# Patient Record
Sex: Female | Born: 1985 | Race: White | Hispanic: No | Marital: Married | Smoking: Never smoker
Health system: Southern US, Community
[De-identification: ages and names within clinical notes are randomized; demographics above are authoritative.]

## PROBLEM LIST (undated history)

## (undated) ENCOUNTER — Inpatient Hospital Stay (HOSPITAL_COMMUNITY): Payer: Self-pay

## (undated) DIAGNOSIS — F419 Anxiety disorder, unspecified: Secondary | ICD-10-CM

## (undated) HISTORY — PX: WISDOM TOOTH EXTRACTION: SHX21

---
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
## 2008-02-24 ENCOUNTER — Encounter: Payer: Self-pay | Admitting: Internal Medicine

## 2009-05-16 ENCOUNTER — Encounter: Payer: Self-pay | Admitting: Internal Medicine

## 2009-05-17 ENCOUNTER — Encounter: Payer: Self-pay | Admitting: Internal Medicine

## 2009-05-29 ENCOUNTER — Encounter: Payer: Self-pay | Admitting: Internal Medicine

## 2009-07-03 ENCOUNTER — Encounter: Payer: Self-pay | Admitting: Internal Medicine

## 2009-07-07 ENCOUNTER — Ambulatory Visit: Payer: Self-pay | Admitting: Cardiology

## 2009-07-07 ENCOUNTER — Encounter: Payer: Self-pay | Admitting: Internal Medicine

## 2009-07-24 ENCOUNTER — Ambulatory Visit: Payer: Self-pay | Admitting: Internal Medicine

## 2009-07-24 ENCOUNTER — Encounter (INDEPENDENT_AMBULATORY_CARE_PROVIDER_SITE_OTHER): Payer: Self-pay | Admitting: *Deleted

## 2009-07-24 DIAGNOSIS — R9389 Abnormal findings on diagnostic imaging of other specified body structures: Secondary | ICD-10-CM | POA: Insufficient documentation

## 2009-07-24 DIAGNOSIS — R072 Precordial pain: Secondary | ICD-10-CM | POA: Insufficient documentation

## 2009-07-24 HISTORY — DX: Abnormal findings on diagnostic imaging of other specified body structures: R93.89

## 2009-07-24 HISTORY — DX: Precordial pain: R07.2

## 2009-07-26 ENCOUNTER — Telehealth: Payer: Self-pay | Admitting: Internal Medicine

## 2009-07-31 ENCOUNTER — Telehealth: Payer: Self-pay | Admitting: Internal Medicine

## 2009-08-02 ENCOUNTER — Ambulatory Visit: Payer: Self-pay | Admitting: Internal Medicine

## 2009-08-02 ENCOUNTER — Encounter (INDEPENDENT_AMBULATORY_CARE_PROVIDER_SITE_OTHER): Payer: Self-pay | Admitting: *Deleted

## 2009-08-02 DIAGNOSIS — R071 Chest pain on breathing: Secondary | ICD-10-CM

## 2009-08-02 DIAGNOSIS — R1013 Epigastric pain: Secondary | ICD-10-CM | POA: Insufficient documentation

## 2009-08-02 HISTORY — DX: Chest pain on breathing: R07.1

## 2009-08-02 HISTORY — DX: Epigastric pain: R10.13

## 2009-08-03 ENCOUNTER — Ambulatory Visit: Payer: Self-pay | Admitting: Internal Medicine

## 2009-08-10 ENCOUNTER — Telehealth: Payer: Self-pay | Admitting: Internal Medicine

## 2009-08-31 ENCOUNTER — Ambulatory Visit: Payer: Self-pay | Admitting: Psychology

## 2009-09-14 ENCOUNTER — Telehealth: Payer: Self-pay | Admitting: Internal Medicine

## 2009-11-09 ENCOUNTER — Encounter: Payer: Self-pay | Admitting: Internal Medicine
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
## 2010-05-01 NOTE — Letter (Signed)
Summary: EGD Instructions  [facility]  [address] [place], [place] [address]   Phone: [phone]  Fax: [phone]       Sheila Little    1986/03/31    MRN: [id number]       Procedure Day Dorna Bloom: Lenor Coffin, 08/03/09     Arrival Time: 1:30 PM      Procedure Time: 2:30 PM     Location of Procedure:                    _X_ [facility] (4th Floor)   PREPARATION FOR ENDOSCOPY   On THURSDAY, 08/03/09, THE DAY OF THE PROCEDURE:  1.   No solid foods, milk or milk products are allowed after midnight the night before your procedure.  2.   Do not drink anything colored red or purple.  Avoid juices with pulp.  No orange juice.  3.  You may drink clear liquids until 12:30 PM, which is 2 hours before your procedure.                                                                                                CLEAR LIQUIDS INCLUDE: Water Jello Ice Popsicles Tea (sugar ok, no milk/cream) Powdered fruit flavored drinks Coffee (sugar ok, no milk/cream) Gatorade Juice: apple, white grape, white cranberry  Lemonade Clear bullion, consomm, broth Carbonated beverages (any kind) Strained chicken noodle soup Hard Candy   MEDICATION INSTRUCTIONS  Unless otherwise instructed, you should take regular prescription medications with a small sip of water as early as possible the morning of your procedure.                 OTHER INSTRUCTIONS  You will need a responsible adult at least 23 years of age to accompany you and drive you home.   This person must remain in the waiting room during your procedure.  Wear loose fitting clothing that is easily removed.  Leave jewelry and other valuables at home.  However, you may wish to bring a book to read or an iPod/MP3 player to listen to music as you wait for your procedure to start.  Remove all body piercing jewelry and leave at home.  Total time from sign-in until discharge is approximately 2-3 hours.  You  should go home directly after your procedure and rest.  You can resume normal activities the day after your procedure.  The day of your procedure you should not:   Drive   Make legal decisions   Operate machinery   Drink alcohol   Return to work  You will receive specific instructions about eating, activities and medications before you leave.    The above instructions have been reviewed and explained to me by   _______________________    I fully understand and can verbalize these instructions _____________________________ Date _________

## 2010-05-01 NOTE — Letter (Signed)
Summary: Work Writer, Main Office  [address]   [place], [place] [address]   Phone: [phone]  Fax: [phone]     July 24, 2009    [facility]   The above named patient had a medical visit today at:  12:00 pm.  Please take this into consideration when reviewing the time away from school.      Sincerely yours,   Dr. Arvilla Meres [facility]

## 2010-05-01 NOTE — Progress Notes (Signed)
Summary: needs GI appt next week (with Dr. Leone Payor)  Phone Note From Other Clinic   Caller: Dr. Gala Romney Summary of Call: request for appt with me next week re: abdominal pain call received Apr 25 will arrange for appt next week and contact patient with info Initial call taken by: Iva Boop MD, Clementeen Graham,  July 26, 2009 7:25 AM  Follow-up for Phone Call        Patient  aware of referral and she is scheduled to see Dr Leone Payor 08/02/09 3:15 Follow-up by: Darcey Nora RN, CGRN,  July 26, 2009 8:40 AM

## 2010-05-01 NOTE — Progress Notes (Signed)
Summary: cx'd appts.  Phone Note Outgoing Call Call back at [facility] Phone [phone]   Call placed by: Dr. Gala Romney Call placed to: Patient Details of Request: Dolores Patty, MD, [facility]  September 14, 2009 5:13 PM  Summary of Call: Patient was scheduled for f/u visit and repeat echo to day to reasses RV and pulmonary pressures but she did not show for appt. I called patient and she said she still isn't feeling better but has been working with Dr. Dellia Cloud. I suggested that she should at least have a f/u echo +/- a f/u visit in our clinic. She agreed and I told her we would reschedule. Dolores Patty, MD, [facility]  September 14, 2009 5:13 PM   Follow-up for Phone Call        order placed Meredith Staggers, RN  September 15, 2009 8:10 AM

## 2010-05-01 NOTE — Consult Note (Signed)
Summary: [facility]  [facility]   Imported By: Marylou Mccoy 08/08/2009 09:16:52  _____________________________________________________________________  External Attachment:    Type:   Image     Comment:   External Document

## 2010-05-01 NOTE — Progress Notes (Signed)
Summary: waiting on appt from dr. Orland Mustard  Phone Note Call from Patient Call back at Home Phone [phone]   Caller: Patient Reason for Call: Talk to Nurse, Referral Details for Reason: per pt calling waiting on appt to see dr. Orland Mustard. waiting on response from office  Initial call taken by: Lorne Skeens,  Aug 10, 2009 11:40 AM  Follow-up for Phone Call        spooke w/Jennifer w/Dr Orland Mustard she will call and sch appt and let me know when done Meredith Staggers, RN  Aug 10, 2009 11:53 AM   pt is sch to see Dr Orland Mustard on June 2nd at [facility], RN  Aug 10, 2009 12:10 PM
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
## 2010-05-01 NOTE — Miscellaneous (Signed)
Summary: Nurse, mental health Party Release Form   Imported By: Roderic Ovens 07/28/2009 13:40:13  _____________________________________________________________________  External Attachment:    Type:   Image     Comment:   External Document

## 2010-05-01 NOTE — Procedures (Signed)
Summary: Upper Endoscopy  Patient: Sheila Little Note: All result statuses are Final unless otherwise noted.  Tests: (1) Upper Endoscopy (EGD)   EGD Upper Endoscopy       DONE     [facility]     [address]     [place], [place]  [address]           ENDOSCOPY PROCEDURE REPORT           PATIENT:  [name], [name]  MR#:  [id number]     BIRTHDATE:  01/26/1986, 23 yrs. old  GENDER:  female           ENDOSCOPIST:  Iva Boop, MD, [facility]     Referred by:  Bevelyn Buckles. Bensimhon, M.D.           PROCEDURE DATE:  08/03/2009     PROCEDURE:  EGD with biopsy     ASA CLASS:  Class I     INDICATIONS:  epigastric pain           MEDICATIONS:   Fentanyl 75 mcg IV, Versed 8 mg IV, Benadryl 25 mg     IV     TOPICAL ANESTHETIC:  Exactacain Spray           DESCRIPTION OF PROCEDURE:   After the risks benefits and     alternatives of the procedure were thoroughly explained, informed     consent was obtained.  The LB GIF-H180 [id number] endoscope was     introduced through the mouth and advanced to the second portion of     the duodenum, without limitations.  The instrument was slowly     withdrawn as the mucosa was fully examined.     <<PROCEDUREIMAGES>>           Mild gastritis was found in the antrum. Mottled and erythematous     mucosa raising ? of gastritis. Multiple biopsies were obtained and     sent to pathology.  Otherwise the examination was normal.     Retroflexed views revealed no abnormalities.    The scope was then     withdrawn from the patient and the procedure completed.           COMPLICATIONS:  None           ENDOSCOPIC IMPRESSION:     1) ?Mild gastritis in the antrum - biospied     2) Otherwise normal examination     RECOMMENDATIONS:     1) Await biopsy results - will contact her about these     2) Stop ibuprofen and start Voltaren gel 4 times a day (2 grams)     to lwer chest and upper abdomen.     3) She has already had an ultrasound of abdomen and I am  awiting     review (reported normal).           REPEAT EXAM:  In for as needed.           Iva Boop, MD, Clementeen Graham           CC:  Dolores Patty, MD     Donzetta Sprung, MD     The Patient           n.     Rosalie Doctor:   Iva Boop at 08/03/2009 02:52 PM           Santiago Bumpers, [id number]  Note: An exclamation mark (!) indicates a result that was not  dispersed into the flowsheet. Document Creation Date: 08/03/2009 2:53 PM _______________________________________________________________________  (1) Order result status: Final Collection or observation date-time: 08/03/2009 14:39 Requested date-time:  Receipt date-time:  Reported date-time:  Referring Physician:   Ordering Physician: Stan Head [id number]) Specimen Source:  Source: Launa Grill Order Number: [id number] Lab site:
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
## 2010-05-01 NOTE — Miscellaneous (Signed)
Summary: echo order    Clinical Lists Changes  Orders: Added new Referral order of Echocardiogram (Echo) - Signed

## 2010-05-01 NOTE — Miscellaneous (Signed)
Summary: voltaren gel rx, d/c ibuprofen  Clinical Lists Changes  Medications: Added new medication of VOLTAREN 1 % GEL (DICLOFENAC SODIUM) apply 2 grams to tender lower chest and upper abdominal area 4 times a day - replaces ibuprofen - Signed Removed medication of IBUPROFEN 800 MG TABS (IBUPROFEN) 1 by mouth three times a day Rx of VOLTAREN 1 % GEL (DICLOFENAC SODIUM) apply 2 grams to tender lower chest and upper abdominal area 4 times a day - replaces ibuprofen;  #100 grams x 0;  Signed;  Entered by: Iva Boop MD, Clementeen Graham;  Authorized by: Iva Boop MD, [facility];  Method used: Electronically to [facility] [address] [place], [place], [place]  [address], Ph: [phone], Fax: [phone]    Prescriptions: VOLTAREN 1 % GEL (DICLOFENAC SODIUM) apply 2 grams to tender lower chest and upper abdominal area 4 times a day - replaces ibuprofen  #100 grams x 0   Entered and Authorized by:   Iva Boop MD, [facility]   Signed by:   Iva Boop MD, [facility] on 08/03/2009   Method used:   Electronically to        [facility]* (retail)       [address]       [place], [place]  [address]       Ph: [phone]       Fax: [phone]   RxID:   [id number]

## 2014-01-11 ENCOUNTER — Other Ambulatory Visit: Payer: Self-pay | Admitting: Obstetrics and Gynecology

## 2014-01-12 LAB — CYTOLOGY - PAP

## 2015-08-10 DIAGNOSIS — G43519 Persistent migraine aura without cerebral infarction, intractable, without status migrainosus: Secondary | ICD-10-CM | POA: Diagnosis not present

## 2015-08-10 DIAGNOSIS — Z681 Body mass index (BMI) 19 or less, adult: Secondary | ICD-10-CM | POA: Diagnosis not present

## 2015-08-21 DIAGNOSIS — Z681 Body mass index (BMI) 19 or less, adult: Secondary | ICD-10-CM | POA: Diagnosis not present

## 2015-08-21 DIAGNOSIS — G43911 Migraine, unspecified, intractable, with status migrainosus: Secondary | ICD-10-CM | POA: Diagnosis not present

## 2015-08-21 DIAGNOSIS — R110 Nausea: Secondary | ICD-10-CM | POA: Diagnosis not present

## 2015-08-22 DIAGNOSIS — Z79899 Other long term (current) drug therapy: Secondary | ICD-10-CM | POA: Diagnosis not present

## 2015-08-22 DIAGNOSIS — G43719 Chronic migraine without aura, intractable, without status migrainosus: Secondary | ICD-10-CM | POA: Diagnosis not present

## 2015-08-22 DIAGNOSIS — R51 Headache: Secondary | ICD-10-CM | POA: Diagnosis not present

## 2015-08-22 DIAGNOSIS — Z049 Encounter for examination and observation for unspecified reason: Secondary | ICD-10-CM | POA: Diagnosis not present

## 2015-09-05 DIAGNOSIS — F410 Panic disorder [episodic paroxysmal anxiety] without agoraphobia: Secondary | ICD-10-CM | POA: Diagnosis not present

## 2015-09-05 DIAGNOSIS — Z681 Body mass index (BMI) 19 or less, adult: Secondary | ICD-10-CM | POA: Diagnosis not present

## 2015-09-12 DIAGNOSIS — H5213 Myopia, bilateral: Secondary | ICD-10-CM | POA: Diagnosis not present

## 2015-10-26 DIAGNOSIS — J0101 Acute recurrent maxillary sinusitis: Secondary | ICD-10-CM | POA: Diagnosis not present

## 2015-10-26 DIAGNOSIS — Z681 Body mass index (BMI) 19 or less, adult: Secondary | ICD-10-CM | POA: Diagnosis not present

## 2015-11-13 DIAGNOSIS — D1800 Hemangioma unspecified site: Secondary | ICD-10-CM | POA: Diagnosis not present

## 2015-11-13 DIAGNOSIS — D2330 Other benign neoplasm of skin of unspecified part of face: Secondary | ICD-10-CM | POA: Diagnosis not present

## 2015-11-28 DIAGNOSIS — R1032 Left lower quadrant pain: Secondary | ICD-10-CM | POA: Diagnosis not present

## 2015-11-28 DIAGNOSIS — Z3202 Encounter for pregnancy test, result negative: Secondary | ICD-10-CM | POA: Diagnosis not present

## 2015-12-26 DIAGNOSIS — J019 Acute sinusitis, unspecified: Secondary | ICD-10-CM | POA: Diagnosis not present

## 2015-12-28 DIAGNOSIS — Z681 Body mass index (BMI) 19 or less, adult: Secondary | ICD-10-CM | POA: Diagnosis not present

## 2015-12-28 DIAGNOSIS — J039 Acute tonsillitis, unspecified: Secondary | ICD-10-CM | POA: Diagnosis not present

## 2016-01-25 DIAGNOSIS — Z681 Body mass index (BMI) 19 or less, adult: Secondary | ICD-10-CM | POA: Diagnosis not present

## 2016-01-25 DIAGNOSIS — R112 Nausea with vomiting, unspecified: Secondary | ICD-10-CM | POA: Diagnosis not present

## 2016-01-25 DIAGNOSIS — R5383 Other fatigue: Secondary | ICD-10-CM | POA: Diagnosis not present

## 2016-03-01 DIAGNOSIS — J019 Acute sinusitis, unspecified: Secondary | ICD-10-CM | POA: Diagnosis not present

## 2016-04-01 NOTE — L&D Delivery Note (Signed)
Delivery Note At 3:03 AM a viable female was delivered via Vaginal, Spontaneous Delivery OA  APGAR:9 9 weight pending  Placenta status:spontaneously with 3 vessel cord , .  Cord:  with the following complications: none.  Cord pH: not obtained  Anesthesia:  epidural Episiotomy:  none Lacerations:  first Suture Repair: 3.0 chromic Est. Blood Loss (mL):  300  Mom to postpartum.  Baby to Couplet care / Skin to Skin.  Carmisha Larusso L 10/30/2016, 3:20 AM

## 2016-04-03 DIAGNOSIS — N911 Secondary amenorrhea: Secondary | ICD-10-CM | POA: Diagnosis not present

## 2016-04-10 DIAGNOSIS — Z3A09 9 weeks gestation of pregnancy: Secondary | ICD-10-CM | POA: Diagnosis not present

## 2016-04-10 DIAGNOSIS — Z3401 Encounter for supervision of normal first pregnancy, first trimester: Secondary | ICD-10-CM | POA: Diagnosis not present

## 2016-04-10 DIAGNOSIS — Z3480 Encounter for supervision of other normal pregnancy, unspecified trimester: Secondary | ICD-10-CM | POA: Diagnosis not present

## 2016-04-10 LAB — OB RESULTS CONSOLE RUBELLA ANTIBODY, IGM: RUBELLA: IMMUNE

## 2016-04-10 LAB — OB RESULTS CONSOLE RPR: RPR: NONREACTIVE

## 2016-04-10 LAB — OB RESULTS CONSOLE HIV ANTIBODY (ROUTINE TESTING): HIV: NONREACTIVE

## 2016-04-10 LAB — OB RESULTS CONSOLE GC/CHLAMYDIA
CHLAMYDIA, DNA PROBE: NEGATIVE
GC PROBE AMP, GENITAL: NEGATIVE

## 2016-04-10 LAB — OB RESULTS CONSOLE HEPATITIS B SURFACE ANTIGEN: Hepatitis B Surface Ag: NEGATIVE

## 2016-04-24 DIAGNOSIS — Z3A10 10 weeks gestation of pregnancy: Secondary | ICD-10-CM | POA: Diagnosis not present

## 2016-04-24 DIAGNOSIS — Z3480 Encounter for supervision of other normal pregnancy, unspecified trimester: Secondary | ICD-10-CM | POA: Diagnosis not present

## 2016-04-24 DIAGNOSIS — Z3400 Encounter for supervision of normal first pregnancy, unspecified trimester: Secondary | ICD-10-CM | POA: Diagnosis not present

## 2016-04-24 DIAGNOSIS — Z113 Encounter for screening for infections with a predominantly sexual mode of transmission: Secondary | ICD-10-CM | POA: Diagnosis not present

## 2016-04-26 ENCOUNTER — Encounter (HOSPITAL_COMMUNITY): Payer: Self-pay | Admitting: *Deleted

## 2016-04-26 ENCOUNTER — Inpatient Hospital Stay (HOSPITAL_COMMUNITY)
Admission: AD | Admit: 2016-04-26 | Discharge: 2016-04-27 | Disposition: A | Discharge status: Home / Self Care | Payer: BLUE CROSS/BLUE SHIELD | Source: Ambulatory Visit | Attending: Obstetrics and Gynecology | Admitting: Obstetrics and Gynecology

## 2016-04-26 DIAGNOSIS — A084 Viral intestinal infection, unspecified: Secondary | ICD-10-CM | POA: Diagnosis not present

## 2016-04-26 DIAGNOSIS — R110 Nausea: Secondary | ICD-10-CM | POA: Diagnosis not present

## 2016-04-26 DIAGNOSIS — Z3A11 11 weeks gestation of pregnancy: Secondary | ICD-10-CM | POA: Insufficient documentation

## 2016-04-26 DIAGNOSIS — O219 Vomiting of pregnancy, unspecified: Secondary | ICD-10-CM

## 2016-04-26 DIAGNOSIS — O26891 Other specified pregnancy related conditions, first trimester: Secondary | ICD-10-CM | POA: Diagnosis not present

## 2016-04-26 LAB — URINALYSIS, ROUTINE W REFLEX MICROSCOPIC
Bilirubin Urine: NEGATIVE
GLUCOSE, UA: NEGATIVE mg/dL
HGB URINE DIPSTICK: NEGATIVE
Ketones, ur: 80 mg/dL — AB
LEUKOCYTES UA: NEGATIVE
Nitrite: NEGATIVE
PH: 5 (ref 5.0–8.0)
Protein, ur: NEGATIVE mg/dL
Specific Gravity, Urine: 1.027 (ref 1.005–1.030)

## 2016-04-26 LAB — COMPREHENSIVE METABOLIC PANEL
ALK PHOS: 47 U/L (ref 38–126)
ALT: 22 U/L (ref 14–54)
ANION GAP: 11 (ref 5–15)
AST: 38 U/L (ref 15–41)
Albumin: 3.9 g/dL (ref 3.5–5.0)
BILIRUBIN TOTAL: 1.5 mg/dL — AB (ref 0.3–1.2)
BUN: 16 mg/dL (ref 6–20)
CALCIUM: 8.5 mg/dL — AB (ref 8.9–10.3)
CO2: 21 mmol/L — ABNORMAL LOW (ref 22–32)
Chloride: 103 mmol/L (ref 101–111)
Creatinine, Ser: 0.61 mg/dL (ref 0.44–1.00)
GLUCOSE: 114 mg/dL — AB (ref 65–99)
Potassium: 4 mmol/L (ref 3.5–5.1)
Sodium: 135 mmol/L (ref 135–145)
TOTAL PROTEIN: 6.7 g/dL (ref 6.5–8.1)

## 2016-04-26 LAB — CBC
HEMATOCRIT: 35.3 % — AB (ref 36.0–46.0)
HEMOGLOBIN: 12.3 g/dL (ref 12.0–15.0)
MCH: 30.9 pg (ref 26.0–34.0)
MCHC: 34.8 g/dL (ref 30.0–36.0)
MCV: 88.7 fL (ref 78.0–100.0)
Platelets: 192 10*3/uL (ref 150–400)
RBC: 3.98 MIL/uL (ref 3.87–5.11)
RDW: 13.5 % (ref 11.5–15.5)
WBC: 14.1 10*3/uL — ABNORMAL HIGH (ref 4.0–10.5)

## 2016-04-26 MED ORDER — LOPERAMIDE HCL 2 MG PO CAPS
4.0000 mg | ORAL_CAPSULE | ORAL | Status: DC | PRN
  Filled 2016-04-26: qty 2

## 2016-04-26 MED ORDER — LACTATED RINGERS IV BOLUS (SEPSIS): 1000.0000 mL | Freq: Once | INTRAVENOUS | Status: DC

## 2016-04-26 MED ORDER — PROMETHAZINE HCL 25 MG/ML IJ SOLN
25.0000 mg | Freq: Once | INTRAMUSCULAR | Status: AC
  Administered 2016-04-27: 25 mg via INTRAVENOUS
  Filled 2016-04-26: qty 1

## 2016-04-26 NOTE — MAU Note (Signed)
N/V/D since 1630. Feeling weak and faint. Some chest pain.

## 2016-04-26 NOTE — MAU Note (Signed)
PT  SAYS SHE HAS ONLY HAD  NAUSEA   FOR  MORNING  SICKNESS .  TODAY  STARTED  VOMITING  AND  DIARRHEA.    TEMP 100.3

## 2016-04-27 DIAGNOSIS — A084 Viral intestinal infection, unspecified: Secondary | ICD-10-CM

## 2016-04-27 MED ORDER — PROMETHAZINE HCL 25 MG PO TABS: 12.5000 mg | ORAL_TABLET | Freq: Four times a day (QID) | ORAL | 0 refills | Status: DC | PRN

## 2016-04-27 NOTE — MAU Note (Signed)
NO VOMITING  NOR DIARRHEA  SINCE IN MAU
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
## 2016-05-06 DIAGNOSIS — Z3491 Encounter for supervision of normal pregnancy, unspecified, first trimester: Secondary | ICD-10-CM | POA: Diagnosis not present

## 2016-05-06 DIAGNOSIS — Z3683 Encounter for fetal screening for congenital cardiac abnormalities: Secondary | ICD-10-CM | POA: Diagnosis not present

## 2016-05-06 DIAGNOSIS — Z36 Encounter for antenatal screening for chromosomal anomalies: Secondary | ICD-10-CM | POA: Diagnosis not present

## 2016-05-13 DIAGNOSIS — J111 Influenza due to unidentified influenza virus with other respiratory manifestations: Secondary | ICD-10-CM | POA: Diagnosis not present

## 2016-05-13 DIAGNOSIS — Z681 Body mass index (BMI) 19 or less, adult: Secondary | ICD-10-CM | POA: Diagnosis not present

## 2016-05-24 DIAGNOSIS — Z3A15 15 weeks gestation of pregnancy: Secondary | ICD-10-CM | POA: Diagnosis not present

## 2016-05-24 DIAGNOSIS — Z3480 Encounter for supervision of other normal pregnancy, unspecified trimester: Secondary | ICD-10-CM | POA: Diagnosis not present

## 2016-06-25 DIAGNOSIS — Z363 Encounter for antenatal screening for malformations: Secondary | ICD-10-CM | POA: Diagnosis not present

## 2016-07-18 DIAGNOSIS — S338XXA Sprain of other parts of lumbar spine and pelvis, initial encounter: Secondary | ICD-10-CM | POA: Diagnosis not present

## 2016-07-18 DIAGNOSIS — S134XXA Sprain of ligaments of cervical spine, initial encounter: Secondary | ICD-10-CM | POA: Diagnosis not present

## 2016-07-23 DIAGNOSIS — S134XXA Sprain of ligaments of cervical spine, initial encounter: Secondary | ICD-10-CM | POA: Diagnosis not present

## 2016-07-23 DIAGNOSIS — S338XXA Sprain of other parts of lumbar spine and pelvis, initial encounter: Secondary | ICD-10-CM | POA: Diagnosis not present

## 2016-07-25 DIAGNOSIS — J019 Acute sinusitis, unspecified: Secondary | ICD-10-CM | POA: Diagnosis not present

## 2016-07-25 DIAGNOSIS — S134XXA Sprain of ligaments of cervical spine, initial encounter: Secondary | ICD-10-CM | POA: Diagnosis not present

## 2016-07-25 DIAGNOSIS — S338XXA Sprain of other parts of lumbar spine and pelvis, initial encounter: Secondary | ICD-10-CM | POA: Diagnosis not present

## 2016-07-25 DIAGNOSIS — Z6820 Body mass index (BMI) 20.0-20.9, adult: Secondary | ICD-10-CM | POA: Diagnosis not present

## 2016-07-29 DIAGNOSIS — S338XXA Sprain of other parts of lumbar spine and pelvis, initial encounter: Secondary | ICD-10-CM | POA: Diagnosis not present

## 2016-07-29 DIAGNOSIS — S134XXA Sprain of ligaments of cervical spine, initial encounter: Secondary | ICD-10-CM | POA: Diagnosis not present

## 2016-08-05 DIAGNOSIS — S134XXA Sprain of ligaments of cervical spine, initial encounter: Secondary | ICD-10-CM | POA: Diagnosis not present

## 2016-08-05 DIAGNOSIS — S338XXA Sprain of other parts of lumbar spine and pelvis, initial encounter: Secondary | ICD-10-CM | POA: Diagnosis not present

## 2016-08-07 DIAGNOSIS — K602 Anal fissure, unspecified: Secondary | ICD-10-CM | POA: Diagnosis not present

## 2016-08-08 DIAGNOSIS — S338XXA Sprain of other parts of lumbar spine and pelvis, initial encounter: Secondary | ICD-10-CM | POA: Diagnosis not present

## 2016-08-08 DIAGNOSIS — S134XXA Sprain of ligaments of cervical spine, initial encounter: Secondary | ICD-10-CM | POA: Diagnosis not present

## 2016-08-19 DIAGNOSIS — Z3A27 27 weeks gestation of pregnancy: Secondary | ICD-10-CM | POA: Diagnosis not present

## 2016-08-19 DIAGNOSIS — S338XXA Sprain of other parts of lumbar spine and pelvis, initial encounter: Secondary | ICD-10-CM | POA: Diagnosis not present

## 2016-08-19 DIAGNOSIS — Z3480 Encounter for supervision of other normal pregnancy, unspecified trimester: Secondary | ICD-10-CM | POA: Diagnosis not present

## 2016-08-19 DIAGNOSIS — Z23 Encounter for immunization: Secondary | ICD-10-CM | POA: Diagnosis not present

## 2016-08-19 DIAGNOSIS — S134XXA Sprain of ligaments of cervical spine, initial encounter: Secondary | ICD-10-CM | POA: Diagnosis not present

## 2016-08-27 DIAGNOSIS — S134XXA Sprain of ligaments of cervical spine, initial encounter: Secondary | ICD-10-CM | POA: Diagnosis not present

## 2016-08-27 DIAGNOSIS — S338XXA Sprain of other parts of lumbar spine and pelvis, initial encounter: Secondary | ICD-10-CM | POA: Diagnosis not present

## 2016-09-02 DIAGNOSIS — D649 Anemia, unspecified: Secondary | ICD-10-CM | POA: Diagnosis not present

## 2016-09-03 DIAGNOSIS — S134XXA Sprain of ligaments of cervical spine, initial encounter: Secondary | ICD-10-CM | POA: Diagnosis not present

## 2016-09-03 DIAGNOSIS — S338XXA Sprain of other parts of lumbar spine and pelvis, initial encounter: Secondary | ICD-10-CM | POA: Diagnosis not present

## 2016-09-09 DIAGNOSIS — S134XXA Sprain of ligaments of cervical spine, initial encounter: Secondary | ICD-10-CM | POA: Diagnosis not present

## 2016-09-09 DIAGNOSIS — S338XXA Sprain of other parts of lumbar spine and pelvis, initial encounter: Secondary | ICD-10-CM | POA: Diagnosis not present

## 2016-09-16 DIAGNOSIS — Z36 Encounter for antenatal screening for chromosomal anomalies: Secondary | ICD-10-CM | POA: Diagnosis not present

## 2016-09-16 DIAGNOSIS — S338XXA Sprain of other parts of lumbar spine and pelvis, initial encounter: Secondary | ICD-10-CM | POA: Diagnosis not present

## 2016-09-16 DIAGNOSIS — S134XXA Sprain of ligaments of cervical spine, initial encounter: Secondary | ICD-10-CM | POA: Diagnosis not present

## 2016-09-24 DIAGNOSIS — S338XXA Sprain of other parts of lumbar spine and pelvis, initial encounter: Secondary | ICD-10-CM | POA: Diagnosis not present

## 2016-09-24 DIAGNOSIS — S134XXA Sprain of ligaments of cervical spine, initial encounter: Secondary | ICD-10-CM | POA: Diagnosis not present

## 2016-09-30 DIAGNOSIS — S338XXA Sprain of other parts of lumbar spine and pelvis, initial encounter: Secondary | ICD-10-CM | POA: Diagnosis not present

## 2016-09-30 DIAGNOSIS — S134XXA Sprain of ligaments of cervical spine, initial encounter: Secondary | ICD-10-CM | POA: Diagnosis not present

## 2016-10-03 ENCOUNTER — Other Ambulatory Visit (HOSPITAL_COMMUNITY): Payer: Self-pay | Admitting: Obstetrics and Gynecology

## 2016-10-03 DIAGNOSIS — K645 Perianal venous thrombosis: Secondary | ICD-10-CM | POA: Diagnosis not present

## 2016-10-03 DIAGNOSIS — R1030 Lower abdominal pain, unspecified: Secondary | ICD-10-CM

## 2016-10-07 ENCOUNTER — Ambulatory Visit (HOSPITAL_COMMUNITY)
Admission: RE | Admit: 2016-10-07 | Discharge: 2016-10-07 | Disposition: A | Discharge status: Home / Self Care | Payer: BLUE CROSS/BLUE SHIELD | Source: Ambulatory Visit | Attending: Obstetrics and Gynecology | Admitting: Obstetrics and Gynecology

## 2016-10-07 ENCOUNTER — Other Ambulatory Visit (HOSPITAL_COMMUNITY): Payer: Self-pay | Admitting: Obstetrics and Gynecology

## 2016-10-07 ENCOUNTER — Ambulatory Visit (HOSPITAL_COMMUNITY): Payer: BLUE CROSS/BLUE SHIELD

## 2016-10-07 DIAGNOSIS — R938 Abnormal findings on diagnostic imaging of other specified body structures: Secondary | ICD-10-CM | POA: Insufficient documentation

## 2016-10-07 DIAGNOSIS — Z3A34 34 weeks gestation of pregnancy: Secondary | ICD-10-CM | POA: Insufficient documentation

## 2016-10-07 DIAGNOSIS — O26893 Other specified pregnancy related conditions, third trimester: Secondary | ICD-10-CM | POA: Insufficient documentation

## 2016-10-07 DIAGNOSIS — S134XXA Sprain of ligaments of cervical spine, initial encounter: Secondary | ICD-10-CM | POA: Diagnosis not present

## 2016-10-07 DIAGNOSIS — R102 Pelvic and perineal pain: Secondary | ICD-10-CM | POA: Insufficient documentation

## 2016-10-07 DIAGNOSIS — S338XXA Sprain of other parts of lumbar spine and pelvis, initial encounter: Secondary | ICD-10-CM | POA: Diagnosis not present

## 2016-10-07 DIAGNOSIS — R1030 Lower abdominal pain, unspecified: Secondary | ICD-10-CM

## 2016-10-08 DIAGNOSIS — Z36 Encounter for antenatal screening for chromosomal anomalies: Secondary | ICD-10-CM | POA: Diagnosis not present

## 2016-10-14 DIAGNOSIS — D509 Iron deficiency anemia, unspecified: Secondary | ICD-10-CM | POA: Diagnosis not present

## 2016-10-14 DIAGNOSIS — S338XXA Sprain of other parts of lumbar spine and pelvis, initial encounter: Secondary | ICD-10-CM | POA: Diagnosis not present

## 2016-10-14 DIAGNOSIS — S134XXA Sprain of ligaments of cervical spine, initial encounter: Secondary | ICD-10-CM | POA: Diagnosis not present

## 2016-10-22 DIAGNOSIS — S338XXA Sprain of other parts of lumbar spine and pelvis, initial encounter: Secondary | ICD-10-CM | POA: Diagnosis not present

## 2016-10-22 DIAGNOSIS — S134XXA Sprain of ligaments of cervical spine, initial encounter: Secondary | ICD-10-CM | POA: Diagnosis not present

## 2016-10-24 DIAGNOSIS — Z3685 Encounter for antenatal screening for Streptococcus B: Secondary | ICD-10-CM | POA: Diagnosis not present

## 2016-10-24 DIAGNOSIS — Z3480 Encounter for supervision of other normal pregnancy, unspecified trimester: Secondary | ICD-10-CM | POA: Diagnosis not present

## 2016-10-29 ENCOUNTER — Inpatient Hospital Stay (HOSPITAL_COMMUNITY): Payer: BLUE CROSS/BLUE SHIELD | Admitting: Anesthesiology

## 2016-10-29 ENCOUNTER — Inpatient Hospital Stay (HOSPITAL_COMMUNITY)
Admission: AD | Admit: 2016-10-29 | Discharge: 2016-10-31 | DRG: 775 | Disposition: A | Discharge status: Home / Self Care | Payer: BLUE CROSS/BLUE SHIELD | Source: Ambulatory Visit | Attending: Obstetrics and Gynecology | Admitting: Obstetrics and Gynecology

## 2016-10-29 ENCOUNTER — Encounter (HOSPITAL_COMMUNITY): Payer: Self-pay | Admitting: *Deleted

## 2016-10-29 DIAGNOSIS — Z3490 Encounter for supervision of normal pregnancy, unspecified, unspecified trimester: Secondary | ICD-10-CM

## 2016-10-29 DIAGNOSIS — Z3493 Encounter for supervision of normal pregnancy, unspecified, third trimester: Secondary | ICD-10-CM | POA: Diagnosis not present

## 2016-10-29 DIAGNOSIS — Z3A37 37 weeks gestation of pregnancy: Secondary | ICD-10-CM | POA: Diagnosis not present

## 2016-10-29 DIAGNOSIS — R58 Hemorrhage, not elsewhere classified: Secondary | ICD-10-CM

## 2016-10-29 DIAGNOSIS — D509 Iron deficiency anemia, unspecified: Secondary | ICD-10-CM | POA: Diagnosis not present

## 2016-10-29 LAB — CBC
HCT: 33.7 % — ABNORMAL LOW (ref 36.0–46.0)
Hemoglobin: 11.4 g/dL — ABNORMAL LOW (ref 12.0–15.0)
MCH: 31.2 pg (ref 26.0–34.0)
MCHC: 33.8 g/dL (ref 30.0–36.0)
MCV: 92.3 fL (ref 78.0–100.0)
PLATELETS: 159 10*3/uL (ref 150–400)
RBC: 3.65 MIL/uL — AB (ref 3.87–5.11)
RDW: 16.2 % — ABNORMAL HIGH (ref 11.5–15.5)
WBC: 8.1 10*3/uL (ref 4.0–10.5)

## 2016-10-29 LAB — TYPE AND SCREEN
ABO/RH(D): O POS
ANTIBODY SCREEN: NEGATIVE

## 2016-10-29 LAB — OB RESULTS CONSOLE GBS: GBS: NEGATIVE

## 2016-10-29 LAB — ABO/RH: ABO/RH(D): O POS

## 2016-10-29 MED ORDER — DIPHENHYDRAMINE HCL 50 MG/ML IJ SOLN: 12.5000 mg | INTRAMUSCULAR | Status: DC | PRN

## 2016-10-29 MED ORDER — LACTATED RINGERS IV SOLN
500.0000 mL | INTRAVENOUS | Status: DC | PRN
  Administered 2016-10-29: 1000 mL via INTRAVENOUS

## 2016-10-29 MED ORDER — ACETAMINOPHEN 325 MG PO TABS
650.0000 mg | ORAL_TABLET | ORAL | Status: DC | PRN
  Administered 2016-10-30: 650 mg via ORAL
  Filled 2016-10-29: qty 2

## 2016-10-29 MED ORDER — EPHEDRINE 5 MG/ML INJ
10.0000 mg | INTRAVENOUS | Status: DC | PRN
  Filled 2016-10-29: qty 2
  Filled 2016-10-29: qty 4

## 2016-10-29 MED ORDER — LACTATED RINGERS IV SOLN
INTRAVENOUS | Status: DC
  Administered 2016-10-29 (×3): via INTRAVENOUS

## 2016-10-29 MED ORDER — OXYCODONE-ACETAMINOPHEN 5-325 MG PO TABS: 1.0000 | ORAL_TABLET | ORAL | Status: DC | PRN

## 2016-10-29 MED ORDER — LIDOCAINE HCL (PF) 1 % IJ SOLN
INTRAMUSCULAR | Status: DC | PRN
  Administered 2016-10-29: 4 mL
  Administered 2016-10-29: 6 mL via EPIDURAL

## 2016-10-29 MED ORDER — LIDOCAINE HCL (PF) 1 % IJ SOLN
30.0000 mL | INTRAMUSCULAR | Status: DC | PRN
  Filled 2016-10-29: qty 30

## 2016-10-29 MED ORDER — SOD CITRATE-CITRIC ACID 500-334 MG/5ML PO SOLN: 30.0000 mL | ORAL | Status: DC | PRN

## 2016-10-29 MED ORDER — FENTANYL 2.5 MCG/ML BUPIVACAINE 1/10 % EPIDURAL INFUSION (WH - ANES)
14.0000 mL/h | INTRAMUSCULAR | Status: DC | PRN
  Administered 2016-10-29 – 2016-10-30 (×3): 14 mL/h via EPIDURAL
  Filled 2016-10-29 (×3): qty 100

## 2016-10-29 MED ORDER — PHENYLEPHRINE 40 MCG/ML (10ML) SYRINGE FOR IV PUSH (FOR BLOOD PRESSURE SUPPORT)
80.0000 ug | PREFILLED_SYRINGE | INTRAVENOUS | Status: DC | PRN
  Filled 2016-10-29: qty 5

## 2016-10-29 MED ORDER — EPHEDRINE 5 MG/ML INJ
10.0000 mg | INTRAVENOUS | Status: DC | PRN
  Filled 2016-10-29: qty 2

## 2016-10-29 MED ORDER — PHENYLEPHRINE 40 MCG/ML (10ML) SYRINGE FOR IV PUSH (FOR BLOOD PRESSURE SUPPORT)
80.0000 ug | PREFILLED_SYRINGE | INTRAVENOUS | Status: DC | PRN
  Filled 2016-10-29: qty 10
  Filled 2016-10-29: qty 5

## 2016-10-29 MED ORDER — LACTATED RINGERS IV SOLN: 500.0000 mL | Freq: Once | INTRAVENOUS | Status: DC

## 2016-10-29 MED ORDER — OXYTOCIN 40 UNITS IN LACTATED RINGERS INFUSION - SIMPLE MED
2.5000 [IU]/h | INTRAVENOUS | Status: DC
  Administered 2016-10-30: 2.5 [IU]/h via INTRAVENOUS

## 2016-10-29 MED ORDER — OXYCODONE-ACETAMINOPHEN 5-325 MG PO TABS: 2.0000 | ORAL_TABLET | ORAL | Status: DC | PRN

## 2016-10-29 MED ORDER — TERBUTALINE SULFATE 1 MG/ML IJ SOLN
0.2500 mg | Freq: Once | INTRAMUSCULAR | Status: DC | PRN
  Filled 2016-10-29: qty 1

## 2016-10-29 MED ORDER — ONDANSETRON HCL 4 MG/2ML IJ SOLN: 4.0000 mg | Freq: Four times a day (QID) | INTRAMUSCULAR | Status: DC | PRN

## 2016-10-29 MED ORDER — OXYTOCIN 40 UNITS IN LACTATED RINGERS INFUSION - SIMPLE MED
1.0000 m[IU]/min | INTRAVENOUS | Status: DC
  Administered 2016-10-29: 2 m[IU]/min via INTRAVENOUS
  Administered 2016-10-29: 6 m[IU]/min via INTRAVENOUS
  Filled 2016-10-29: qty 1000

## 2016-10-29 MED ORDER — OXYTOCIN BOLUS FROM INFUSION
500.0000 mL | Freq: Once | INTRAVENOUS | Status: AC
  Administered 2016-10-30: 500 mL via INTRAVENOUS

## 2016-10-29 NOTE — H&P (Signed)
Sheila Little is a 31 y.o. G 1 P 0 at 37 w 5 day presents from office. Noted after exam ?SROM and gush of bright red blood. Denies pain. Now status post epidural. OB History    Gravida Para Term Preterm AB Living   1             SAB TAB Ectopic Multiple Live Births                 History reviewed. No pertinent past medical history. Past Surgical History:  Procedure Laterality Date  . WISDOM TOOTH EXTRACTION     Family History: family history is not on file. Social History:  reports that she has never smoked. She has never used smokeless tobacco. She reports that she does not drink alcohol or use drugs.     Maternal Diabetes: No Genetic Screening: Normal Maternal Ultrasounds/Referrals: Normal Fetal Ultrasounds or other Referrals:  None Maternal Substance Abuse:  No Significant Maternal Medications:  None Significant Maternal Lab Results:  None Other Comments:  None  Review of Systems  All other systems reviewed and are negative.  Maternal Medical History:  Reason for admission: Contractions.     Dilation: 3.5 Effacement (%): 60 Station: -2 Exam by:: sowder Blood pressure 123/78, pulse 76, resp. rate 18, height 5\' 8"  (1.727 m), weight 68 kg (150 lb).   Fetal Exam Fetal State Assessment: Category I - tracings are normal.     Physical Exam  Vitals reviewed. Constitutional: She appears well-developed and well-nourished.  HENT:  Head: Normocephalic and atraumatic.  Neck: Normal range of motion.  Cardiovascular: Normal rate and regular rhythm.     Prenatal labs: ABO, Rh: --/--/O POS (07/31 1200) Antibody: PENDING (07/31 1200) Rubella:   RPR:    HBsAg:    HIV:    GBS:     Assessment/Plan: IUP at 37 w 5 day ?SROM Labor Anticipate NSVD   Sheila Little L 10/29/2016, 1:19 PM

## 2016-10-29 NOTE — Anesthesia Preprocedure Evaluation (Signed)
Anesthesia Evaluation  Patient identified by MRN, date of birth, ID band Patient awake    Reviewed: Allergy & Precautions, H&P , Patient's Chart, lab work & pertinent test results  Airway Mallampati: II  TM Distance: >3 FB Neck ROM: full    Dental no notable dental hx.    Pulmonary    Pulmonary exam normal breath sounds clear to auscultation       Cardiovascular Exercise Tolerance: Good  Rhythm:regular Rate:Normal     Neuro/Psych    GI/Hepatic   Endo/Other    Renal/GU      Musculoskeletal   Abdominal   Peds  Hematology   Anesthesia Other Findings   Reproductive/Obstetrics                             Anesthesia Physical  Anesthesia Plan  ASA: II  Anesthesia Plan: Epidural   Post-op Pain Management:    Induction:   PONV Risk Score and Plan:   Airway Management Planned:   Additional Equipment:   Intra-op Plan:   Post-operative Plan:   Informed Consent: I have reviewed the patients History and Physical, chart, labs and discussed the procedure including the risks, benefits and alternatives for the proposed anesthesia with the patient or authorized representative who has indicated his/her understanding and acceptance.     Dental Advisory Given  Plan Discussed with:   Anesthesia Plan Comments: (Labs checked- platelets confirmed with RN in room. Fetal heart tracing, per RN, reported to be stable enough for sitting procedure. Discussed epidural, and patient consents to the procedure:  included risk of possible headache,backache, failed block, allergic reaction, and nerve injury. This patient was asked if she had any questions or concerns before the procedure started.)        Anesthesia Quick Evaluation  

## 2016-10-29 NOTE — Progress Notes (Signed)
Dr Vincente PoliGrewal requesting that patient receive epidural now

## 2016-10-29 NOTE — Progress Notes (Signed)
Patient passed out with IV start.

## 2016-10-29 NOTE — Anesthesia Procedure Notes (Signed)
Epidural Patient location during procedure: OB  Staffing Anesthesiologist: Davene Jobin  Preanesthetic Checklist Completed: patient identified, site marked, surgical consent, pre-op evaluation, timeout performed, IV checked, risks and benefits discussed and monitors and equipment checked  Epidural Patient position: sitting Prep: site prepped and draped and DuraPrep Patient monitoring: continuous pulse ox and blood pressure Approach: midline Location: L3-L4 Injection technique: LOR air  Needle:  Needle type: Tuohy  Needle gauge: 17 G Needle length: 9 cm and 9 Needle insertion depth: 4 cm Catheter type: closed end flexible Catheter size: 19 Gauge Catheter at skin depth: 10 cm Test dose: negative  Assessment Events: blood not aspirated, injection not painful, no injection resistance, negative IV test and no paresthesia  Additional Notes Dosing of Epidural:  1st dose, through catheter .............................................  Xylocaine 40 mg  2nd dose, through catheter, after waiting 3 minutes.........Xylocaine 60 mg    As each dose occurred, patient was free of IV sx; and patient exhibited no evidence of SA injection.  Patient is more comfortable after epidural dosed. Please see RN's note for documentation of vital signs,and FHR which are stable.  Patient reminded not to try to ambulate with numb legs, and that an RN must be present when she attempts to get up.        

## 2016-10-29 NOTE — Progress Notes (Signed)
Patient is comfortable with epidural  BP 140/86   Pulse 74   Temp 98.6 F (37 C)   Resp 20   Ht 5\' 8"  (1.727 m)   Wt 68 kg (150 lb)   BMI 22.81 kg/m   FHR category 1  Cervix no change 80% 5 cm -2 OP   IUPC placed  IUP at 37 w 5 days SROM Given little cervical progress IUPC placed and will start pitocin to obtain adequate labor pattern Follow labor curve

## 2016-10-29 NOTE — Anesthesia Pain Management Evaluation Note (Signed)
  CRNA Pain Management Visit Note  Patient: Sheila SchleinHaleigh B Salguero, 31 y.o., female  "Hello I am a member of the anesthesia team at [facility]. We have an anesthesia team available at all times to provide care throughout the hospital, including epidural management and anesthesia for C-section. I don't know your plan for the delivery whether it a natural birth, water birth, IV sedation, nitrous supplementation, doula or epidural, but we want to meet your pain goals."   1.Was your pain managed to your expectations on prior hospitalizations?   No prior hospitalizations  2.What is your expectation for pain management during this hospitalization?     Epidural  3.How can we help you reach that goal?   Record the patient's initial score and the patient's pain goal.   Pain: 0  Pain Goal: 3 [facility] wants you to be able to say your pain was always managed very well.  Laban EmperorMalinova,Lynn Recendiz Hristova 10/29/2016

## 2016-10-30 ENCOUNTER — Encounter (HOSPITAL_COMMUNITY): Payer: Self-pay | Admitting: Obstetrics and Gynecology

## 2016-10-30 LAB — RPR: RPR: NONREACTIVE

## 2016-10-30 MED ORDER — SIMETHICONE 80 MG PO CHEW: 80.0000 mg | CHEWABLE_TABLET | ORAL | Status: DC | PRN

## 2016-10-30 MED ORDER — FLEET ENEMA 7-19 GM/118ML RE ENEM: 1.0000 | ENEMA | Freq: Every day | RECTAL | Status: DC | PRN

## 2016-10-30 MED ORDER — SENNOSIDES-DOCUSATE SODIUM 8.6-50 MG PO TABS
2.0000 | ORAL_TABLET | ORAL | Status: DC
  Administered 2016-10-30: 2 via ORAL
  Filled 2016-10-30: qty 2

## 2016-10-30 MED ORDER — TETANUS-DIPHTH-ACELL PERTUSSIS 5-2.5-18.5 LF-MCG/0.5 IM SUSP: 0.5000 mL | Freq: Once | INTRAMUSCULAR | Status: DC

## 2016-10-30 MED ORDER — MEASLES, MUMPS & RUBELLA VAC ~~LOC~~ INJ
0.5000 mL | INJECTION | Freq: Once | SUBCUTANEOUS | Status: DC
  Filled 2016-10-30: qty 0.5

## 2016-10-30 MED ORDER — BISACODYL 10 MG RE SUPP: 10.0000 mg | Freq: Every day | RECTAL | Status: DC | PRN

## 2016-10-30 MED ORDER — SODIUM BICARBONATE 8.4 % IV SOLN
INTRAVENOUS | Status: DC | PRN
  Administered 2016-10-30: 7 mL via EPIDURAL

## 2016-10-30 MED ORDER — BENZOCAINE-MENTHOL 20-0.5 % EX AERO
1.0000 "application " | INHALATION_SPRAY | CUTANEOUS | Status: DC | PRN
  Administered 2016-10-31: 1 via TOPICAL
  Filled 2016-10-30 (×2): qty 56

## 2016-10-30 MED ORDER — DIPHENHYDRAMINE HCL 25 MG PO CAPS: 25.0000 mg | ORAL_CAPSULE | Freq: Four times a day (QID) | ORAL | Status: DC | PRN

## 2016-10-30 MED ORDER — MEDROXYPROGESTERONE ACETATE 150 MG/ML IM SUSP: 150.0000 mg | INTRAMUSCULAR | Status: DC | PRN

## 2016-10-30 MED ORDER — ONDANSETRON HCL 4 MG PO TABS: 4.0000 mg | ORAL_TABLET | ORAL | Status: DC | PRN

## 2016-10-30 MED ORDER — ZOLPIDEM TARTRATE 5 MG PO TABS: 5.0000 mg | ORAL_TABLET | Freq: Every evening | ORAL | Status: DC | PRN

## 2016-10-30 MED ORDER — IBUPROFEN 600 MG PO TABS
600.0000 mg | ORAL_TABLET | Freq: Four times a day (QID) | ORAL | Status: DC
  Administered 2016-10-30 – 2016-10-31 (×6): 600 mg via ORAL
  Filled 2016-10-30 (×6): qty 1

## 2016-10-30 MED ORDER — COCONUT OIL OIL: 1.0000 "application " | TOPICAL_OIL | Status: DC | PRN

## 2016-10-30 MED ORDER — WITCH HAZEL-GLYCERIN EX PADS: 1.0000 "application " | MEDICATED_PAD | CUTANEOUS | Status: DC | PRN

## 2016-10-30 MED ORDER — ONDANSETRON HCL 4 MG/2ML IJ SOLN: 4.0000 mg | INTRAMUSCULAR | Status: DC | PRN

## 2016-10-30 MED ORDER — ACETAMINOPHEN 325 MG PO TABS
650.0000 mg | ORAL_TABLET | ORAL | Status: DC | PRN
  Administered 2016-10-30 – 2016-10-31 (×3): 650 mg via ORAL
  Filled 2016-10-30 (×3): qty 2

## 2016-10-30 MED ORDER — PRENATAL MULTIVITAMIN CH
1.0000 | ORAL_TABLET | Freq: Every day | ORAL | Status: DC
  Administered 2016-10-30 – 2016-10-31 (×2): 1 via ORAL
  Filled 2016-10-30 (×2): qty 1

## 2016-10-30 MED ORDER — DIBUCAINE 1 % RE OINT: 1.0000 "application " | TOPICAL_OINTMENT | RECTAL | Status: DC | PRN

## 2016-10-30 NOTE — Progress Notes (Signed)
Post Partum Day DOD Subjective: no complaints and up ad lib  Objective: Blood pressure 109/67, pulse 92, temperature 98.6 F (37 C), temperature source Oral, resp. rate 18, height 5\' 8"  (1.727 m), weight 150 lb (68 kg), SpO2 100 %, unknown if currently breastfeeding.  Physical Exam:  General: alert, cooperative and no distress Lochia: appropriate Uterine Fundus: firm Incision: healing well DVT Evaluation: No evidence of DVT seen on physical exam.   Recent Labs  10/29/16 1157  HGB 11.4*  HCT 33.7*    Assessment/Plan: Plan for discharge tomorrow   LOS: 1 day   Kreig Parson II,Randy Castrejon E 10/30/2016, 8:52 AM

## 2016-10-30 NOTE — Anesthesia Postprocedure Evaluation (Signed)
Anesthesia Post Note  Patient: Sheila Little  Procedure(s) Performed: * No procedures listed *     Patient location during evaluation: Mother Baby Anesthesia Type: Epidural Level of consciousness: awake and alert and oriented Pain management: pain level controlled Vital Signs Assessment: post-procedure vital signs reviewed and stable Respiratory status: spontaneous breathing and nonlabored ventilation Cardiovascular status: stable Postop Assessment: no headache, patient able to bend at knees, no backache, no signs of nausea or vomiting, epidural receding and adequate PO intake Anesthetic complications: no    Last Vitals:  Vitals:   10/30/16 0455 10/30/16 0559  BP: 126/76 109/67  Pulse: 96 92  Resp: 18 18  Temp: 37.1 C 37 C    Last Pain:  Vitals:   10/30/16 0559  TempSrc: Oral  PainSc: 0-No pain   Pain Goal:                 Land O'LakesMalinova,Cougar Imel Hristova

## 2016-10-30 NOTE — Lactation Note (Signed)
This note was copied from a baby's chart. Lactation Consultation Note  Patient Name: Sheila Little JYNWG'NToday's Date: 10/30/2016 Reason for consult: Follow-up assessment  Mom had called for latch assist. Infant latched w/relative ease. Mandible lowered to widen gape/increase comfort. Mom still feels some pinching even after modifications. Mom to see what nipple looks like when baby unlatches. Mom has # to call.   Mom reports + breast changes w/pregnancy. Infant w/ABO set-up.   Lurline HareRichey, Sereen Schaff [facility] 10/30/2016, 5:34 PM

## 2016-10-30 NOTE — Plan of Care (Signed)
Problem: Activity: Goal: Ability to tolerate increased activity will improve Outcome: Completed/Met Date Met: 10/30/16 Pt ambulated to the bathroom and back to bed without assistance.  RN was on standby but the pt did not need help with walking.  The pt did need help changing the pads because she said she "gets dizzy when she sees blood."  Pt reports her mother and husband will help her when she is at home.

## 2016-10-30 NOTE — Plan of Care (Signed)
Problem: Activity: Goal: Ability to tolerate increased activity will improve Outcome: Progressing Pt ambulating in room without difficulty.  Pt still calling for assistance to bathroom because she reports that is when she feels dizzy and lightheaded.  Will continue to monitor.   Problem: Urinary Elimination: Goal: Ability to reestablish a normal urinary elimination pattern will improve Outcome: Completed/Met Date Met: 10/30/16 Pt voiding without difficulty.

## 2016-10-30 NOTE — Progress Notes (Signed)
Patient feeling pressure  C C +2  Start pushing

## 2016-10-30 NOTE — Lactation Note (Signed)
This note was copied from a baby's chart. Lactation Consultation Note  Patient Name: Girl Duwayne HeckHaleigh Crandall MWNUU'VToday's Date: 10/30/2016 Reason for consult: Initial assessment Baby at 13 hr of life. Upon entry there were multiple visitors that were taking pictures with the sleeping baby. Mom stated baby has been latching well when she is not sleeping but she feels like baby is sleeping too much. She reports the last latch was painful. She declined latch help at this visit because she would like her family to visit with the baby. She requested lactation phone number to call at the next feeding. Discussed LPT baby behavior, feeding frequency, baby belly size, voids, wt loss, breast changes, and nipple care. Mom stated she can manually express and has a spoon in the room. Given lactation handouts. Aware of OP services and support group. Mom will offer the breast on demand, post express, and offer expressed milk per volume guidelines with a spoon.     Maternal Data Has patient been taught Hand Expression?: Yes (per mom) Does the patient have breastfeeding experience prior to this delivery?: No  Feeding Feeding Type: Breast Fed Length of feed: 15 min  LATCH Score                   Interventions    Lactation Tools Discussed/Used     Consult Status Consult Status: Follow-up Date: 10/30/16 Follow-up type: In-patient    Rulon Eisenmengerlizabeth E Aiyah Scarpelli 10/30/2016, 4:53 PM

## 2016-10-31 LAB — CBC
HEMATOCRIT: 26.9 % — AB (ref 36.0–46.0)
HEMOGLOBIN: 9 g/dL — AB (ref 12.0–15.0)
MCH: 31.5 pg (ref 26.0–34.0)
MCHC: 33.5 g/dL (ref 30.0–36.0)
MCV: 94.1 fL (ref 78.0–100.0)
Platelets: 131 10*3/uL — ABNORMAL LOW (ref 150–400)
RBC: 2.86 MIL/uL — AB (ref 3.87–5.11)
RDW: 16.6 % — ABNORMAL HIGH (ref 11.5–15.5)
WBC: 12.3 10*3/uL — AB (ref 4.0–10.5)

## 2016-10-31 NOTE — Lactation Note (Signed)
This note was copied from a baby's chart. Lactation Consultation Note  Patient Name: Sheila Duwayne HeckHaleigh Ulbrich ZOXWR'UToday's Date: 10/31/2016 Reason for consult: Follow-up assessment;Late-preterm 34-36.6wks;Infant weight loss (7% weight loss )  Baby is 32 hours old ,  As LC entered the room, baby waking up , LC changed wet diaper.  LC assisted mom with latching on both breast, cross cradle on the left breast, and football on the right.  Swallows noted on both breast and depth. Initially on the left mom having some discomfort until LC gently eased baby's  Chin down, and on the right she was comfortable. Per mom breast are fuller today.  Sore nipple and engorgement prevention and tx reviewed.  LC instructed mom on the use hand pump , shells , comfort gels .  Per mom has DEBP / Medela at home.  Recommended prior to latch - breast massage, hand express, pre-pump if needed and latch with breast compressions.  Firm support. Watch for nutritive vs non - nutritive feeding patterns and watch for hanging out.  STS feedings until the baby can stay awake for feedings.  LC recommending adding post pumping after 3-4 feedings 10 -15 mins , save milk and feed back to baby with a spoon.  Mother informed of post-discharge support and given phone number to the lactation department, including services for phone call assistance; out-patient appointments; and breastfeeding support group. List of other breastfeeding resources in the community given in the handout. Encouraged mother to call for problems or concerns related to breastfeeding.   Maternal Data Has patient been taught Hand Expression?: Yes  Feeding Feeding Type: Breast Fed Length of feed:  (swallows noted )  LATCH Score Latch: Grasps breast easily, tongue down, lips flanged, rhythmical sucking.  Audible Swallowing: Spontaneous and intermittent  Type of Nipple: Everted at rest and after stimulation  Comfort (Breast/Nipple): Filling, red/small blisters or  bruises, mild/mod discomfort  Hold (Positioning): Assistance needed to correctly position infant at breast and maintain latch.  LATCH Score: 8  Interventions Interventions: Breast feeding basics reviewed;Assisted with latch;Skin to skin;Breast massage;Hand express;Breast compression;Adjust position;Support pillows;Position options;Coconut oil;Shells;Comfort gels;Hand pump  Lactation Tools Discussed/Used Tools: Pump;Shells;Comfort gels Shell Type: Inverted Breast pump type: Manual   Consult Status Consult Status: Follow-up Date: 10/31/16 Follow-up type: In-patient    Matilde SprangMargaret Ann Clariece Roesler 10/31/2016, 12:09 PM

## 2016-10-31 NOTE — Discharge Summary (Signed)
Obstetric Discharge Summary Reason for Admission: onset of labor Prenatal Procedures: none Intrapartum Procedures: spontaneous vaginal delivery Postpartum Procedures: none Complications-Operative and Postpartum: none Hemoglobin  Date Value Ref Range Status  10/31/2016 9.0 (L) 12.0 - 15.0 g/dL Final    Comment:    DELTA CHECK NOTED REPEATED TO VERIFY    HCT  Date Value Ref Range Status  10/31/2016 26.9 (L) 36.0 - 46.0 % Final    Physical Exam:  General: alert Lochia: appropriate Uterine Fundus: firm Incision: healing well DVT Evaluation: No evidence of DVT seen on physical exam.  Discharge Diagnoses: Term Pregnancy-delivered  Discharge Information: Date: 10/31/2016 Activity: pelvic rest Diet: routine Medications: PNV, Ibuprofen and Iron Condition: stable Instructions: refer to practice specific booklet Discharge to: home Follow-up Information    [place] Schedule an appointment as soon as possible for a visit in 6 week(s).   Contact information: [address] [place] [id number] [phone]           Newborn Data: Live born female  Birth Weight: 6 lb 10.2 oz (3010 g) APGAR: 9, 9  Home with mother.  Briasia Flinders M 10/31/2016, 8:16 AM

## 2016-10-31 NOTE — Progress Notes (Signed)
MOB was referred for history of depression/anxiety. * Referral screened out by Clinical Social Worker because none of the following criteria appear to apply: ~ History of anxiety/depression during this pregnancy, or of post-partum depression. ~ Diagnosis of anxiety and/or depression within last 3 years OR * MOB's symptoms currently being treated with medication and/or therapy.  Please contact the Clinical Social Worker if needs arise, by MOB request, or if MOB scores 9 or greater/yes to question 10 on Edinburgh Postpartum Depression Screen.   Steven Basso Boyd-Gilyard, MSW, LCSW Clinical Social Work [phone]  

## 2016-12-05 DIAGNOSIS — S134XXA Sprain of ligaments of cervical spine, initial encounter: Secondary | ICD-10-CM | POA: Diagnosis not present

## 2016-12-05 DIAGNOSIS — S338XXA Sprain of other parts of lumbar spine and pelvis, initial encounter: Secondary | ICD-10-CM | POA: Diagnosis not present

## 2016-12-16 DIAGNOSIS — Z1389 Encounter for screening for other disorder: Secondary | ICD-10-CM | POA: Diagnosis not present

## 2016-12-23 ENCOUNTER — Telehealth (HOSPITAL_COMMUNITY): Payer: Self-pay | Admitting: Lactation Services

## 2017-01-02 DIAGNOSIS — S134XXA Sprain of ligaments of cervical spine, initial encounter: Secondary | ICD-10-CM | POA: Diagnosis not present

## 2017-01-02 DIAGNOSIS — S338XXA Sprain of other parts of lumbar spine and pelvis, initial encounter: Secondary | ICD-10-CM | POA: Diagnosis not present

## 2017-01-27 DIAGNOSIS — Z23 Encounter for immunization: Secondary | ICD-10-CM | POA: Diagnosis not present

## 2017-02-11 DIAGNOSIS — N951 Menopausal and female climacteric states: Secondary | ICD-10-CM | POA: Diagnosis not present

## 2017-02-19 DIAGNOSIS — S338XXA Sprain of other parts of lumbar spine and pelvis, initial encounter: Secondary | ICD-10-CM | POA: Diagnosis not present

## 2017-02-19 DIAGNOSIS — S134XXA Sprain of ligaments of cervical spine, initial encounter: Secondary | ICD-10-CM | POA: Diagnosis not present

## 2017-03-19 DIAGNOSIS — L03032 Cellulitis of left toe: Secondary | ICD-10-CM | POA: Diagnosis not present

## 2017-03-19 DIAGNOSIS — M79675 Pain in left toe(s): Secondary | ICD-10-CM | POA: Diagnosis not present

## 2017-03-19 DIAGNOSIS — L600 Ingrowing nail: Secondary | ICD-10-CM | POA: Diagnosis not present

## 2017-04-02 DIAGNOSIS — L03032 Cellulitis of left toe: Secondary | ICD-10-CM | POA: Diagnosis not present

## 2017-04-02 DIAGNOSIS — M79672 Pain in left foot: Secondary | ICD-10-CM | POA: Diagnosis not present

## 2017-04-02 DIAGNOSIS — L600 Ingrowing nail: Secondary | ICD-10-CM | POA: Diagnosis not present

## 2017-04-02 DIAGNOSIS — M79675 Pain in left toe(s): Secondary | ICD-10-CM | POA: Diagnosis not present

## 2017-05-22 DIAGNOSIS — Z681 Body mass index (BMI) 19 or less, adult: Secondary | ICD-10-CM | POA: Diagnosis not present

## 2017-05-22 DIAGNOSIS — R300 Dysuria: Secondary | ICD-10-CM | POA: Diagnosis not present

## 2017-05-22 DIAGNOSIS — N3001 Acute cystitis with hematuria: Secondary | ICD-10-CM | POA: Diagnosis not present

## 2017-05-26 DIAGNOSIS — L570 Actinic keratosis: Secondary | ICD-10-CM | POA: Diagnosis not present

## 2017-05-26 DIAGNOSIS — J301 Allergic rhinitis due to pollen: Secondary | ICD-10-CM | POA: Diagnosis not present

## 2017-05-26 DIAGNOSIS — S134XXA Sprain of ligaments of cervical spine, initial encounter: Secondary | ICD-10-CM | POA: Diagnosis not present

## 2017-05-26 DIAGNOSIS — Z681 Body mass index (BMI) 19 or less, adult: Secondary | ICD-10-CM | POA: Diagnosis not present

## 2017-05-26 DIAGNOSIS — S338XXA Sprain of other parts of lumbar spine and pelvis, initial encounter: Secondary | ICD-10-CM | POA: Diagnosis not present

## 2017-05-26 DIAGNOSIS — L819 Disorder of pigmentation, unspecified: Secondary | ICD-10-CM | POA: Diagnosis not present

## 2017-05-27 DIAGNOSIS — S338XXA Sprain of other parts of lumbar spine and pelvis, initial encounter: Secondary | ICD-10-CM | POA: Diagnosis not present

## 2017-05-27 DIAGNOSIS — S134XXA Sprain of ligaments of cervical spine, initial encounter: Secondary | ICD-10-CM | POA: Diagnosis not present

## 2017-06-18 DIAGNOSIS — M79674 Pain in right toe(s): Secondary | ICD-10-CM | POA: Diagnosis not present

## 2017-06-18 DIAGNOSIS — B351 Tinea unguium: Secondary | ICD-10-CM | POA: Diagnosis not present

## 2017-06-18 DIAGNOSIS — M79672 Pain in left foot: Secondary | ICD-10-CM | POA: Diagnosis not present

## 2017-06-18 DIAGNOSIS — M79671 Pain in right foot: Secondary | ICD-10-CM | POA: Diagnosis not present

## 2017-07-31 DIAGNOSIS — R55 Syncope and collapse: Secondary | ICD-10-CM | POA: Diagnosis not present

## 2017-07-31 DIAGNOSIS — G8323 Monoplegia of upper limb affecting right nondominant side: Secondary | ICD-10-CM | POA: Diagnosis not present

## 2017-07-31 DIAGNOSIS — R51 Headache: Secondary | ICD-10-CM | POA: Diagnosis not present

## 2017-07-31 DIAGNOSIS — R531 Weakness: Secondary | ICD-10-CM | POA: Diagnosis not present

## 2017-07-31 DIAGNOSIS — G8389 Other specified paralytic syndromes: Secondary | ICD-10-CM | POA: Diagnosis not present

## 2017-07-31 DIAGNOSIS — Z6379 Other stressful life events affecting family and household: Secondary | ICD-10-CM | POA: Diagnosis not present

## 2017-07-31 DIAGNOSIS — I6789 Other cerebrovascular disease: Secondary | ICD-10-CM | POA: Diagnosis not present

## 2017-07-31 DIAGNOSIS — M79601 Pain in right arm: Secondary | ICD-10-CM | POA: Diagnosis not present

## 2017-07-31 DIAGNOSIS — M79602 Pain in left arm: Secondary | ICD-10-CM | POA: Diagnosis not present

## 2017-07-31 DIAGNOSIS — R29898 Other symptoms and signs involving the musculoskeletal system: Secondary | ICD-10-CM | POA: Diagnosis not present

## 2017-07-31 DIAGNOSIS — F449 Dissociative and conversion disorder, unspecified: Secondary | ICD-10-CM | POA: Diagnosis not present

## 2017-08-04 DIAGNOSIS — G43019 Migraine without aura, intractable, without status migrainosus: Secondary | ICD-10-CM | POA: Diagnosis not present

## 2017-08-04 DIAGNOSIS — Z681 Body mass index (BMI) 19 or less, adult: Secondary | ICD-10-CM | POA: Diagnosis not present

## 2017-09-08 DIAGNOSIS — S134XXA Sprain of ligaments of cervical spine, initial encounter: Secondary | ICD-10-CM | POA: Diagnosis not present

## 2017-09-08 DIAGNOSIS — S338XXA Sprain of other parts of lumbar spine and pelvis, initial encounter: Secondary | ICD-10-CM | POA: Diagnosis not present

## 2017-09-11 DIAGNOSIS — S338XXA Sprain of other parts of lumbar spine and pelvis, initial encounter: Secondary | ICD-10-CM | POA: Diagnosis not present

## 2017-09-11 DIAGNOSIS — S134XXA Sprain of ligaments of cervical spine, initial encounter: Secondary | ICD-10-CM | POA: Diagnosis not present

## 2017-10-10 DIAGNOSIS — N941 Unspecified dyspareunia: Secondary | ICD-10-CM | POA: Diagnosis not present

## 2017-11-11 DIAGNOSIS — S134XXA Sprain of ligaments of cervical spine, initial encounter: Secondary | ICD-10-CM | POA: Diagnosis not present

## 2017-11-11 DIAGNOSIS — S338XXA Sprain of other parts of lumbar spine and pelvis, initial encounter: Secondary | ICD-10-CM | POA: Diagnosis not present

## 2017-11-12 DIAGNOSIS — S134XXA Sprain of ligaments of cervical spine, initial encounter: Secondary | ICD-10-CM | POA: Diagnosis not present

## 2017-11-12 DIAGNOSIS — S338XXA Sprain of other parts of lumbar spine and pelvis, initial encounter: Secondary | ICD-10-CM | POA: Diagnosis not present

## 2017-12-15 DIAGNOSIS — L819 Disorder of pigmentation, unspecified: Secondary | ICD-10-CM | POA: Diagnosis not present

## 2017-12-15 DIAGNOSIS — L570 Actinic keratosis: Secondary | ICD-10-CM | POA: Diagnosis not present

## 2017-12-29 DIAGNOSIS — Z681 Body mass index (BMI) 19 or less, adult: Secondary | ICD-10-CM | POA: Diagnosis not present

## 2017-12-29 DIAGNOSIS — Z01419 Encounter for gynecological examination (general) (routine) without abnormal findings: Secondary | ICD-10-CM | POA: Diagnosis not present

## 2018-01-12 DIAGNOSIS — J040 Acute laryngitis: Secondary | ICD-10-CM | POA: Diagnosis not present

## 2018-01-12 DIAGNOSIS — Z681 Body mass index (BMI) 19 or less, adult: Secondary | ICD-10-CM | POA: Diagnosis not present

## 2018-01-12 DIAGNOSIS — K1379 Other lesions of oral mucosa: Secondary | ICD-10-CM | POA: Diagnosis not present

## 2018-02-03 DIAGNOSIS — R509 Fever, unspecified: Secondary | ICD-10-CM | POA: Diagnosis not present

## 2018-02-03 DIAGNOSIS — Z681 Body mass index (BMI) 19 or less, adult: Secondary | ICD-10-CM | POA: Diagnosis not present

## 2018-02-03 DIAGNOSIS — J069 Acute upper respiratory infection, unspecified: Secondary | ICD-10-CM | POA: Diagnosis not present

## 2018-02-03 DIAGNOSIS — J0101 Acute recurrent maxillary sinusitis: Secondary | ICD-10-CM | POA: Diagnosis not present

## 2018-02-14 DIAGNOSIS — Z23 Encounter for immunization: Secondary | ICD-10-CM | POA: Diagnosis not present

## 2018-03-19 DIAGNOSIS — Z681 Body mass index (BMI) 19 or less, adult: Secondary | ICD-10-CM | POA: Diagnosis not present

## 2018-03-19 DIAGNOSIS — H6980 Other specified disorders of Eustachian tube, unspecified ear: Secondary | ICD-10-CM | POA: Diagnosis not present

## 2018-12-03 IMAGING — US US PELVIS LIMITED
1 series · 14 of 16 positions shown · non-contrast
Comparison: None.

CLINICAL DATA: Right groin swelling for 3 weeks. Patient is 34
weeks pregnant. Question hernia.

EXAM:
LIMITED ULTRASOUND OF PELVIS
TECHNIQUE: Limited transabdominal ultrasound examination of the pelvis was
performed.

[Series 1: us pelvis limited · 0.06mm/px · 14 of 16 slices shown]
[im 1/16]
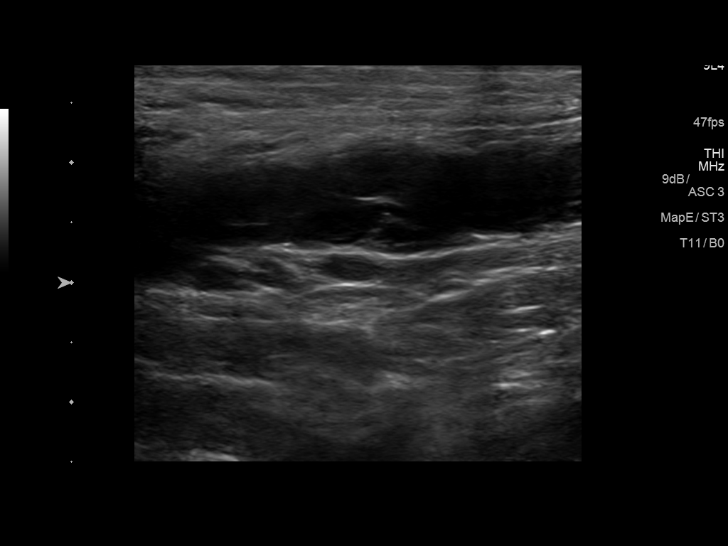
[im 2/16]
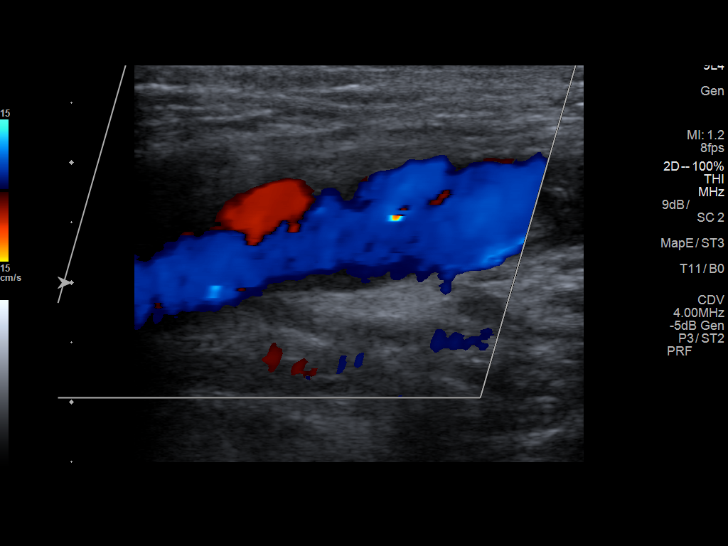
[im 3/16]
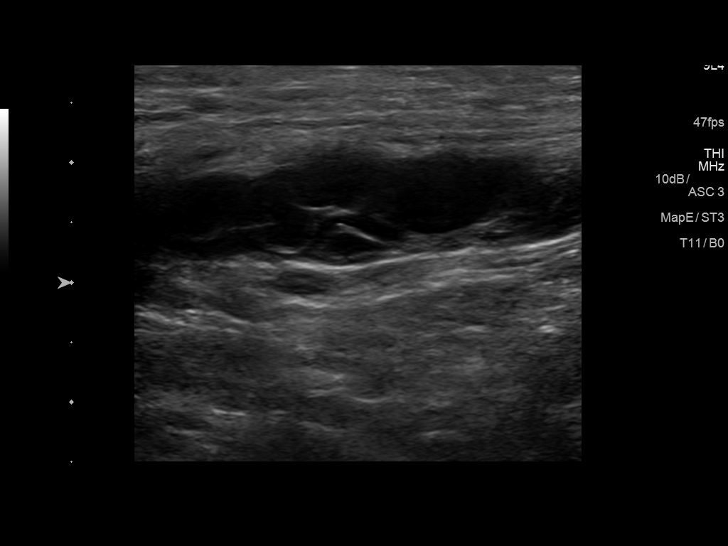
[im 5/16]
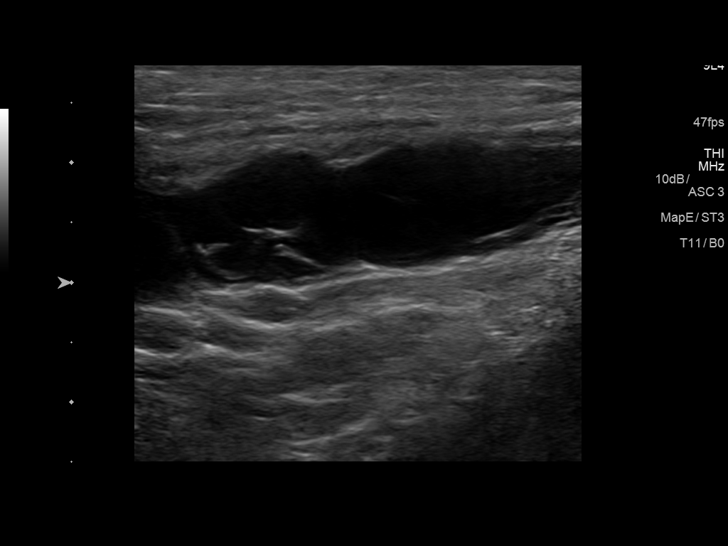
[im 6/16]
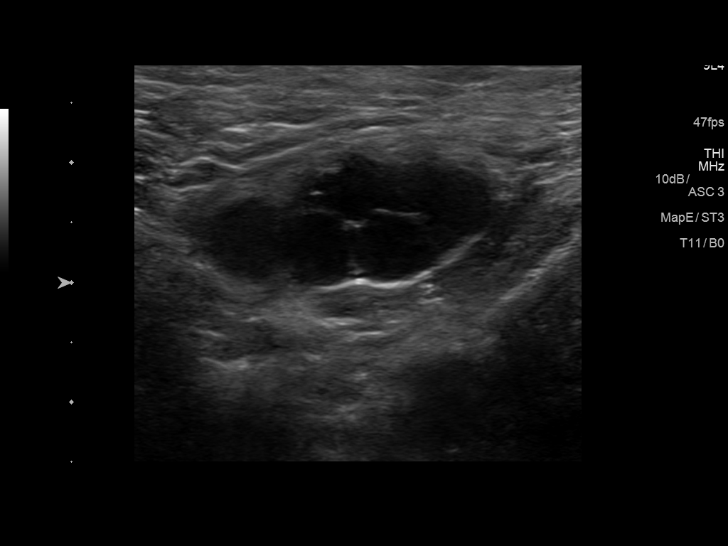
[im 7/16]
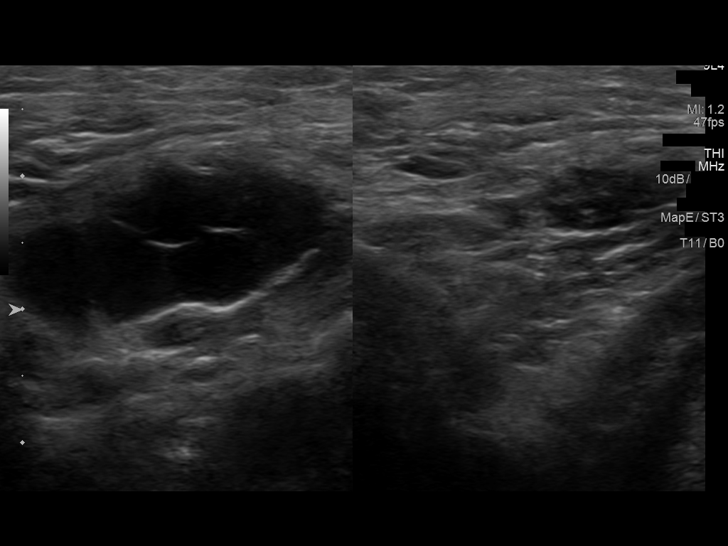
[im 8/16]
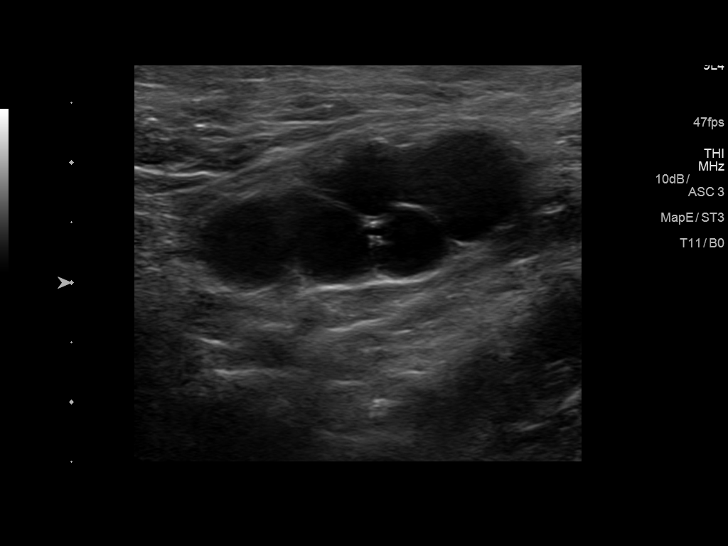
[im 9/16]
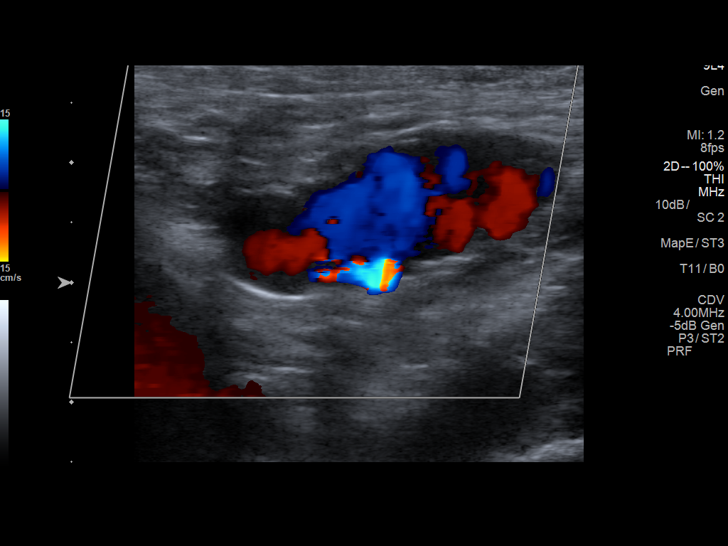
[im 10/16]
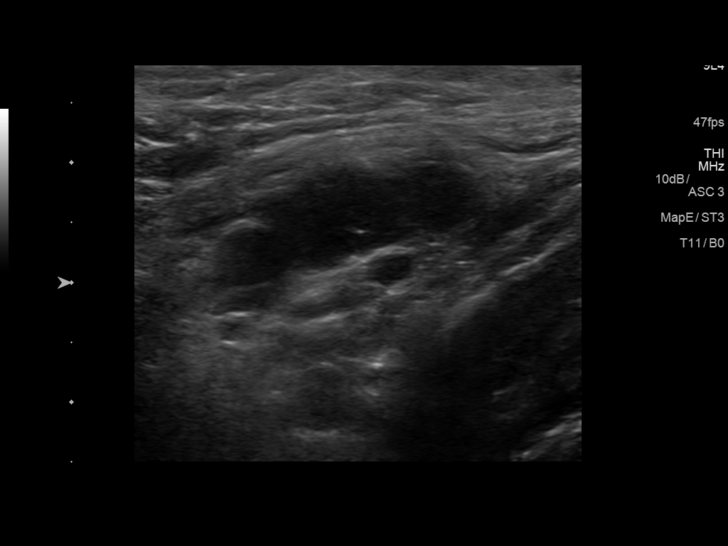
[im 11/16]
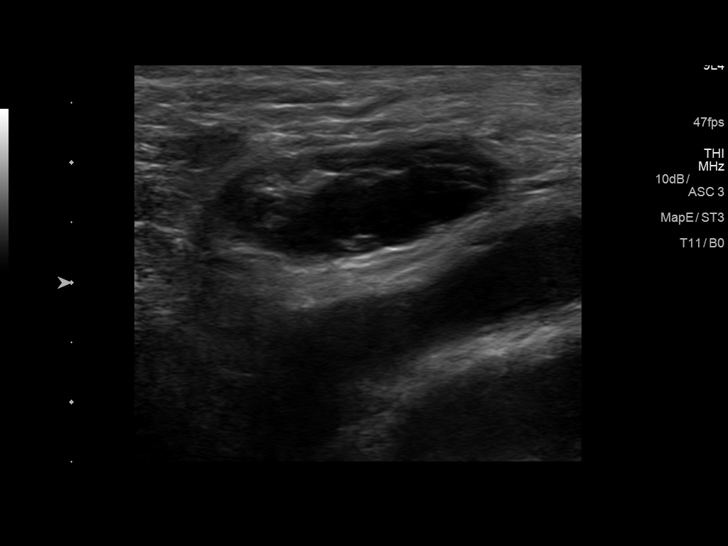
[im 13/16]
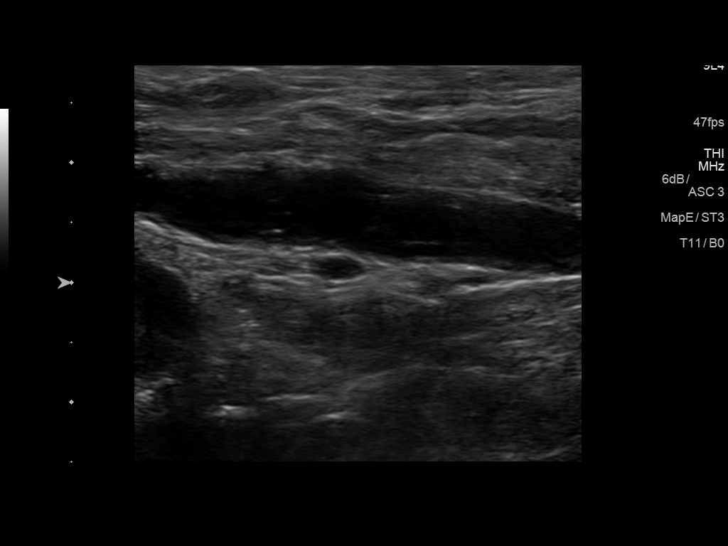
[im 14/16]
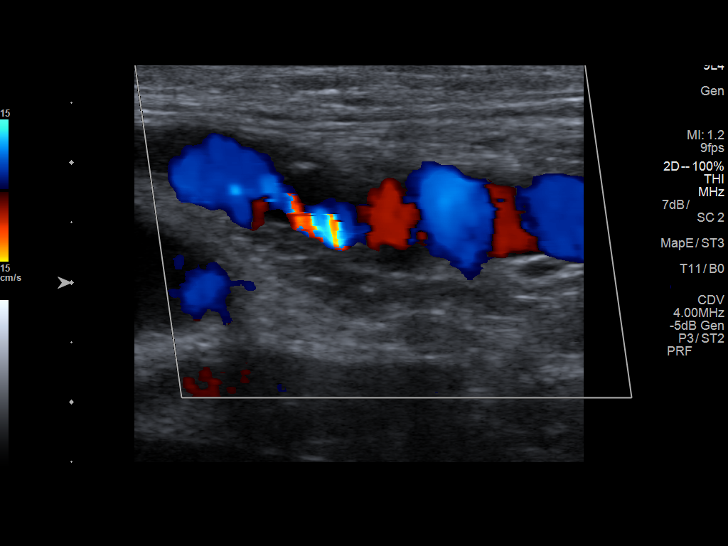
[im 15/16]
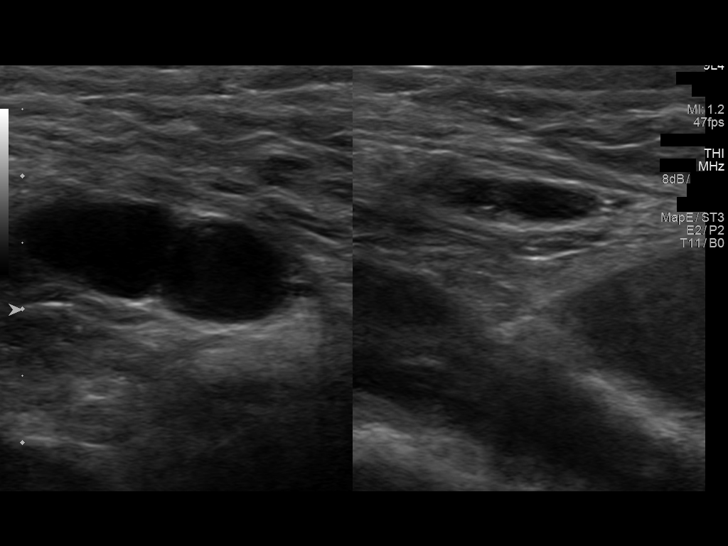
[im 16/16]
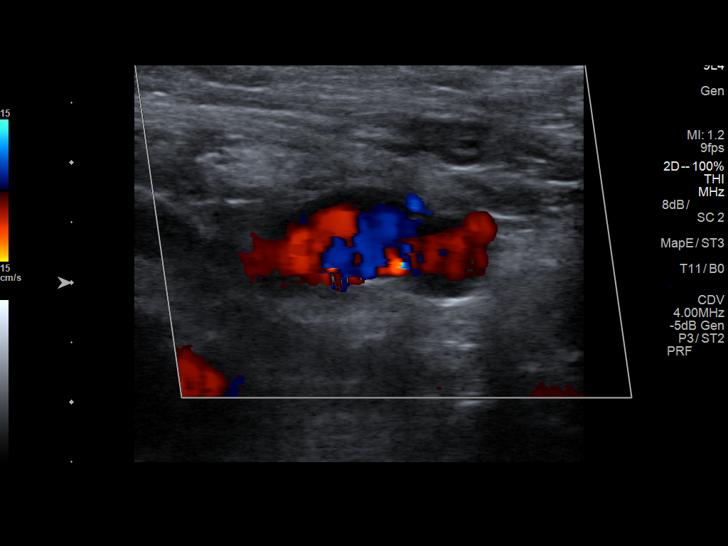

[14 of 16 positions shown; findings below may reference images not displayed]

FINDINGS: In the area of right groin swelling, there is a dilated vascular
structure, likely varicose vein. This is compressible. No
thrombosis. Similar prominent vein is noted in the contralateral
left inguinal region. No other soft tissue mass. No evidence of
hernia.
IMPRESSION: Prominent venous structures in the groins bilaterally, corresponding
to the palpable area in the right inguinal region, likely varicose
veins.

## 2019-02-08 DIAGNOSIS — G43909 Migraine, unspecified, not intractable, without status migrainosus: Secondary | ICD-10-CM | POA: Insufficient documentation

## 2019-04-02 NOTE — L&D Delivery Note (Signed)
Delivery Note  SVD viable female Apgars 8,9 over 1st degree ML lac.  Placenta delivered spontaneously intact with 3VC. Repair with 3-0 Chromic with good support and hemostasis noted.  R/V exam confirms.  PH art was not sent.   Mother and baby to couplet care and are doing well.  EBL 100cc  Candice Camp, MD

## 2019-05-25 LAB — OB RESULTS CONSOLE RPR: RPR: NONREACTIVE

## 2019-05-25 LAB — OB RESULTS CONSOLE ABO/RH: RH Type: POSITIVE

## 2019-05-25 LAB — OB RESULTS CONSOLE ANTIBODY SCREEN: Antibody Screen: NEGATIVE

## 2019-05-25 LAB — OB RESULTS CONSOLE GC/CHLAMYDIA
Chlamydia: NEGATIVE
Gonorrhea: NEGATIVE

## 2019-05-25 LAB — OB RESULTS CONSOLE HEPATITIS B SURFACE ANTIGEN: Hepatitis B Surface Ag: NEGATIVE

## 2019-05-25 LAB — OB RESULTS CONSOLE HIV ANTIBODY (ROUTINE TESTING): HIV: NONREACTIVE

## 2019-05-25 LAB — OB RESULTS CONSOLE RUBELLA ANTIBODY, IGM: Rubella: IMMUNE

## 2019-05-25 LAB — OB RESULTS CONSOLE GBS: GBS: POSITIVE

## 2019-07-16 ENCOUNTER — Telehealth: Payer: Self-pay

## 2019-07-16 NOTE — Telephone Encounter (Signed)
NOTES ON FILE FROM ELISE LEGER [phone]  REFERRAL SENT TO SCHEDULING

## 2019-07-23 ENCOUNTER — Telehealth: Payer: Self-pay

## 2019-07-23 NOTE — Telephone Encounter (Signed)
NOTES ON FILE FROM DR ELISE LEGER [phone].SENT REFERRAL TO SCHEDULING 

## 2019-09-02 ENCOUNTER — Other Ambulatory Visit: Payer: Self-pay

## 2019-09-02 ENCOUNTER — Telehealth: Payer: Self-pay | Admitting: Radiology

## 2019-09-02 ENCOUNTER — Encounter: Payer: Self-pay | Admitting: Cardiology

## 2019-09-02 ENCOUNTER — Ambulatory Visit (INDEPENDENT_AMBULATORY_CARE_PROVIDER_SITE_OTHER): Payer: Managed Care, Other (non HMO) | Admitting: Cardiology

## 2019-09-02 VITALS — BP 110/62 | HR 86 | Ht 68.0 in | Wt 139.6 lb

## 2019-09-02 DIAGNOSIS — R0789 Other chest pain: Secondary | ICD-10-CM | POA: Diagnosis not present

## 2019-09-02 DIAGNOSIS — R002 Palpitations: Secondary | ICD-10-CM | POA: Diagnosis not present

## 2019-09-02 DIAGNOSIS — R42 Dizziness and giddiness: Secondary | ICD-10-CM | POA: Diagnosis not present

## 2019-09-02 NOTE — Progress Notes (Signed)
Cardiology Consult Note    Date:  09/02/2019   ID:  Sheila Little, DOB 1985/05/23, MRN [id number]  PCP:  Richardean Chimera, MD  Cardiologist:  Armanda Magic, MD   Chief Complaint  Patient presents with  . New Patient (Initial Visit)    Palptiations,  chest pain, dizziness    History of Present Illness:  Sheila Little is a 34 y.o. female who is being seen today for the evaluation of chest pain/palpitations and dizziness at the request of Ranae Pila, *.  This is a 34yo female with no prior cardiac hx who presents for evaluation.  She is currently [redacted] weeks pregnant with her second child. She has a daughter who is 2 and is having a boy.  Her first pregnancy was uncomplicated with no cardiac problems.  She tells me that she has always had problems with low Bp since elementary school and chronic dizziness if standing too long.  Her Bp typically runs in the 90's systolic.    About 2 months ago she started noticing chest discomfort that she describes as an uneasy feeling in her chest when her heart races.  She has no chest pressure and no radiation into her arms or neck.  This discomfort was intermittent for 1-2 months but seems to have settled down.  In her first trimester she had problems with Nausea, dizziness, SOB but the nausea resolved in the second trimester and the dizziness and SOB have persisted.    She tells me that she will suddenly notice her heart start to race even when not exerting herself.  She will feel dizzy and get SOB.  Her BP has been running in the 90/40's.  Over the past 3 weeks her SOB and palpitations have improved.  She tries to stay hydrated.  She has had some mild intermittent ankle edema at times.  She has a remote hx of syncope in the past with low Bps but not during this pregnancy.  She was recently noted to have a low TSH and was sent to Endocrinology for further evaluation due to low BS as well and repeat labs were consistent with pregnancy.     She has a very strong fm hx of heart disease.  Her maternal GF had a pacer and most recently DCM requiring LVAD implant.  He is in his 61's.  Her Maternal GM had a heart ablation for arrhythmia. Many of her mother's aunts and uncles have had DCM and one had a heart tx.  She denies any Tobacco or ETOH use.    Past Medical History:  Diagnosis Date  . CHEST WALL PAIN, ANTERIOR 08/02/2009   Qualifier: Diagnosis of  By: Leone Payor MD, Charlyne Quale EPIGASTRIC PAIN, CHRONIC 08/02/2009   Qualifier: Diagnosis of  By: Leone Payor MD, Charlyne Quale NSVD (normal spontaneous vaginal delivery) 10/30/2016  . Precordial pain 07/24/2009   Qualifier: Diagnosis of  By: Gala Romney, MD, Trixie Dredge Term pregnancy 10/29/2016    Past Surgical History:  Procedure Laterality Date  . WISDOM TOOTH EXTRACTION      Current Medications: Current Meds  Medication Sig  . Prenatal Vit-Fe Fumarate-FA (PRENATAL MULTIVITAMIN) TABS tablet Take 1 tablet by mouth daily at 12 noon.    Allergies:   Patient has no known allergies.   Social History   Socioeconomic History  . Marital status: Married    Spouse name: Not on file  . Number of children:  Not on file  . Years of education: Not on file  . Highest education level: Not on file  Occupational History  . Not on file  Tobacco Use  . Smoking status: Never Smoker  . Smokeless tobacco: Never Used  Substance and Sexual Activity  . Alcohol use: No  . Drug use: No  . Sexual activity: Yes  Other Topics Concern  . Not on file  Social History Narrative  . Not on file   Social Determinants of Health   Financial Resource Strain:   . Difficulty of Paying Living Expenses:   Food Insecurity:   . Worried About Programme researcher, broadcasting/film/video in the Last Year:   . Barista in the Last Year:   Transportation Needs:   . Freight forwarder (Medical):   Marland Kitchen Lack of Transportation (Non-Medical):   Physical Activity:   . Days of Exercise per Week:   . Minutes of  Exercise per Session:   Stress:   . Feeling of Stress :   Social Connections:   . Frequency of Communication with Friends and Family:   . Frequency of Social Gatherings with Friends and Family:   . Attends Religious Services:   . Active Member of Clubs or Organizations:   . Attends Banker Meetings:   Marland Kitchen Marital Status:      Family History:  The patient's family history includes Diabetes in her maternal grandmother; Heart disease in her maternal grandmother; Migraines in her mother; Osteoporosis in her maternal grandmother; Rheum arthritis in her maternal grandmother.   ROS:   Please see the history of present illness.    ROS All other systems reviewed and are negative.  No flowsheet data found.     PHYSICAL EXAM:   VS:  BP 110/62   Pulse 86   Ht 5\' 8"  (1.727 m)   Wt 139 lb 9.6 oz (63.3 kg)   BMI 21.23 kg/m     Orthostatic VS for the past 24 hrs (Last 3 readings):  BP- Lying Pulse- Lying BP- Sitting Pulse- Sitting BP- Standing at 0 minutes Pulse- Standing at 0 minutes BP- Standing at 3 minutes Pulse- Standing at 3 minutes  09/02/19 1039 96/61 83 95/58 92 94/63 102 102/66 104   GEN: Well nourished, well developed, in no acute distress  HEENT: normal  Neck: no JVD, carotid bruits, or masses Cardiac: RRR; no murmurs, rubs, or gallops,no edema.  Intact distal pulses bilaterally.  Respiratory:  clear to auscultation bilaterally, normal work of breathing GI: soft, nontender, nondistended, + BS MS: no deformity or atrophy  Skin: warm and dry, no rash Neuro:  Alert and Oriented x 3, Strength and sensation are intact Psych: euthymic mood, full affect  Wt Readings from Last 3 Encounters:  09/02/19 139 lb 9.6 oz (63.3 kg)  10/29/16 150 lb (68 kg)  04/26/16 116 lb (52.6 kg)      Studies/Labs Reviewed:   EKG:  EKG is ordered today.  The ekg ordered today demonstrates NSR   Recent Labs: No results found for requested labs within last 8760 hours.   Lipid  Panel No results found for: CHOL, TRIG, HDL, CHOLHDL, VLDL, LDLCALC, LDLDIRECT  Additional studies/ records that were reviewed today include:  OV notes from OB and labs, EKG    ASSESSMENT:    1. Atypical chest pain   2. Dizziness   3. Palpitations      PLAN:  In order of problems listed above:  1. Atypical chest  pain -her description is not really pain and more an uneasy feeling when her heart is racing -she does have a fm hx on her mom's side of DCM and heart tx which could signal a hereditary CM -I will check a 2D echo to make sure LVF is normal  2.  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  HR started at 83bpm and increased to 104bpm.   -I have encouraged her to liberalize her Na intake unless she develops LE edema and increase fluid intake to at least 64oz daily -Avoid caffeine -may need to consider compression hose during the day  3.  Palpitations -these occur when her BP is low and likely are related to response to drop in BP but she also has times when palpitations occur suddenly and her heart feels like it is beating out of her chest -there was some concern over hyperthyroidism but she was seen by Endocrinology and felt to be normal for pregnancy -I will get a 2 week Ziopatch to assess for arrhythmias    Medication Adjustments/Labs and Tests Ordered: Current medicines are reviewed at length with the patient today.  Concerns regarding medicines are outlined above.  Medication changes, Labs and Tests ordered today are listed in the Patient Instructions below.  Patient Instructions  Medication Instructions:  Your physician recommends that you continue on your current medications as directed. Please refer to the Current  Medication list given to you today.  *If you need a refill on your cardiac medications before your next appointment, please call your pharmacy*  Testing/Procedures: Your physician has recommended that you wear an event monitor. Event monitors are medical devices that record the heart's electrical activity. Doctors most often [place] these monitors to diagnose arrhythmias. Arrhythmias are problems with the speed or rhythm of the heartbeat. The monitor is a small, portable device. You can wear one while you do your normal daily activities. This is usually used to diagnose what is causing palpitations/syncope (passing out).  Your physician has requested that you have an echocardiogram. Echocardiography is a painless test that uses sound waves to create images of your heart. It provides your doctor with information about the size and shape of your heart and how well your heart's chambers and valves are working. This procedure takes approximately one hour. There are no restrictions for this procedure.   Follow-Up: At [facility], you and your health needs are our priority.  As part of our continuing mission to provide you with exceptional heart care, we have created designated Provider Care Teams.  These Care Teams include your primary Cardiologist (physician) and Advanced Practice Providers (APPs -  Physician Assistants and Nurse Practitioners) who all work together to provide you with the care you need, when you need it.  We recommend signing up for the patient portal called "MyChart".  Sign up information is provided on this After Visit Summary.  MyChart is used to connect with patients for Virtual Visits (Telemedicine).  Patients are able to view lab/test results, encounter notes, upcoming appointments, etc.  Non-urgent messages can be sent to your provider as well.   To learn more about what you can do with MyChart, go to [email or web address].    Your next appointment:   4 week(s)  The format  for  your next appointment:   In Person  Provider:   You may see Fransico Him, MD or one of the following Advanced Practice Providers on your designated Care Team:    Melina Copa, PA-C  Ermalinda Barrios, PA-C  Other Instructions Bryn Gulling- Long Term Monitor Instructions   Your physician has requested you wear your ZIO patch monitor_14_days.   This is a single patch monitor.  [employer] supplies one patch monitor per enrollment.  Additional stickers are not available.   Please do not apply patch if you will be having a Nuclear Stress Test, Echocardiogram, Cardiac CT, MRI, or Chest Xray during the time frame you would be wearing the monitor. The patch cannot be worn during these tests.  You cannot remove and re-apply the ZIO XT patch monitor.   Your ZIO patch monitor will be sent USPS Priority mail from [facility] directly to your home address. The monitor may also be mailed to a PO BOX if home delivery is not available.   It may take 3-5 days to receive your monitor after you have been enrolled.   Once you have received you monitor, please review enclosed instructions.  Your monitor has already been registered assigning a specific monitor serial # to you.   Applying the monitor   Shave hair from upper left chest.   Hold abrader disc by orange tab.  Rub abrader in 40 strokes over left upper chest as indicated in your monitor instructions.   Clean area with 4 enclosed alcohol pads .  Use all pads to assure are is cleaned thoroughly.  Let dry.   Apply patch as indicated in monitor instructions.  Patch will be place under collarbone on left side of chest with arrow pointing upward.   Rub patch adhesive wings for 2 minutes.Remove white label marked "1".  Remove white label marked "2".  Rub patch adhesive wings for 2 additional minutes.   While looking in a mirror, press and release button in center of patch.  A small green light will flash 3-4 times .  This will be your only indicator the  monitor has been turned on.     Do not shower for the first 24 hours.  You may shower after the first 24 hours.   Press button if you feel a symptom. You will hear a small click.  Record Date, Time and Symptom in the Patient Log Book.   When you are ready to remove patch, follow instructions on last 2 pages of Patient Log Book.  Stick patch monitor onto last page of Patient Log Book.   Place Patient Log Book in [place] box.  Use locking tab on box and tape box closed securely.  The Orange and [employer] has [employer] on it.  Please place in mailbox as soon as possible.  Your physician should have your test results approximately 7 days after the monitor has been mailed back to [facility].   Call [place] at [phone] if you have questions regarding your ZIO XT patch monitor.  Call them immediately if you see an orange light blinking on your monitor.   If your monitor falls off in less than 4 days contact our Monitor department at [phone].  If your monitor becomes loose or falls off after 4 days call [employer] at [phone] for suggestions on securing your monitor.       Signed, Fransico Him, MD  09/02/2019 11:09 AM    [facility]  [address] [place], [place]  [address] Phone: [phone]; Fax: [phone]

## 2019-09-02 NOTE — Telephone Encounter (Signed)
Enrolled patient for a 14 day Zio monitor to be mailed to patients home.  

## 2019-09-02 NOTE — Patient Instructions (Signed)
Medication Instructions:  Your physician recommends that you continue on your current medications as directed. Please refer to the Current Medication list given to you today.  *If you need a refill on your cardiac medications before your next appointment, please call your pharmacy*  Testing/Procedures: Your physician has recommended that you wear an event monitor. Event monitors are medical devices that record the heart's electrical activity. Doctors most often [place] these monitors to diagnose arrhythmias. Arrhythmias are problems with the speed or rhythm of the heartbeat. The monitor is a small, portable device. You can wear one while you do your normal daily activities. This is usually used to diagnose what is causing palpitations/syncope (passing out).  Your physician has requested that you have an echocardiogram. Echocardiography is a painless test that uses sound waves to create images of your heart. It provides your doctor with information about the size and shape of your heart and how well your heart's chambers and valves are working. This procedure takes approximately one hour. There are no restrictions for this procedure.   Follow-Up: At [facility], you and your health needs are our priority.  As part of our continuing mission to provide you with exceptional heart care, we have created designated Provider Care Teams.  These Care Teams include your primary Cardiologist (physician) and Advanced Practice Providers (APPs -  Physician Assistants and Nurse Practitioners) who all work together to provide you with the care you need, when you need it.  We recommend signing up for the patient portal called "MyChart".  Sign up information is provided on this After Visit Summary.  MyChart is used to connect with patients for Virtual Visits (Telemedicine).  Patients are able to view lab/test results, encounter notes, upcoming appointments, etc.  Non-urgent messages can be sent to your provider as well.    To learn more about what you can do with MyChart, go to [email or web address].    Your next appointment:   4 week(s)  The format for your next appointment:   In Person  Provider:   You may see Fransico Him, MD or one of the following Advanced Practice Providers on your designated Care Team:    Melina Copa, PA-C  Ermalinda Barrios, PA-C  Other Instructions Bryn Gulling- Long Term Monitor Instructions   Your physician has requested you wear your ZIO patch monitor_14_days.   This is a single patch monitor.  Irhythm supplies one patch monitor per enrollment.  Additional stickers are not available.   Please do not apply patch if you will be having a Nuclear Stress Test, Echocardiogram, Cardiac CT, MRI, or Chest Xray during the time frame you would be wearing the monitor. The patch cannot be worn during these tests.  You cannot remove and re-apply the ZIO XT patch monitor.   Your ZIO patch monitor will be sent USPS Priority mail from [facility] directly to your home address. The monitor may also be mailed to a PO BOX if home delivery is not available.   It may take 3-5 days to receive your monitor after you have been enrolled.   Once you have received you monitor, please review enclosed instructions.  Your monitor has already been registered assigning a specific monitor serial # to you.   Applying the monitor   Shave hair from upper left chest.   Hold abrader disc by orange tab.  Rub abrader in 40 strokes over left upper chest as indicated in your monitor instructions.   Clean area with 4 enclosed alcohol pads .  Use all pads to assure are is cleaned thoroughly.  Let dry.   Apply patch as indicated in monitor instructions.  Patch will be place under collarbone on left side of chest with arrow pointing upward.   Rub patch adhesive wings for 2 minutes.Remove white label marked "1".  Remove white label marked "2".  Rub patch adhesive wings for 2 additional minutes.   While looking  in a mirror, press and release button in center of patch.  A small green light will flash 3-4 times .  This will be your only indicator the monitor has been turned on.     Do not shower for the first 24 hours.  You may shower after the first 24 hours.   Press button if you feel a symptom. You will hear a small click.  Record Date, Time and Symptom in the Patient Log Book.   When you are ready to remove patch, follow instructions on last 2 pages of Patient Log Book.  Stick patch monitor onto last page of Patient Log Book.   Place Patient Log Book in [place] box.  Use locking tab on box and tape box closed securely.  The Orange and [employer] has [employer] on it.  Please place in mailbox as soon as possible.  Your physician should have your test results approximately 7 days after the monitor has been mailed back to [facility].   Call [facility] Customer Care at [phone] if you have questions regarding your ZIO XT patch monitor.  Call them immediately if you see an orange light blinking on your monitor.   If your monitor falls off in less than 4 days contact our Monitor department at [phone].  If your monitor becomes loose or falls off after 4 days call [employer] at [phone] for suggestions on securing your monitor.

## 2019-09-11 ENCOUNTER — Other Ambulatory Visit (INDEPENDENT_AMBULATORY_CARE_PROVIDER_SITE_OTHER): Payer: Managed Care, Other (non HMO)

## 2019-09-11 DIAGNOSIS — R42 Dizziness and giddiness: Secondary | ICD-10-CM | POA: Diagnosis not present

## 2019-09-11 DIAGNOSIS — R002 Palpitations: Secondary | ICD-10-CM

## 2019-09-23 ENCOUNTER — Other Ambulatory Visit (HOSPITAL_COMMUNITY): Payer: Managed Care, Other (non HMO)

## 2019-09-29 ENCOUNTER — Ambulatory Visit: Payer: Managed Care, Other (non HMO) | Admitting: Physician Assistant

## 2019-10-15 ENCOUNTER — Ambulatory Visit (HOSPITAL_COMMUNITY): Payer: Managed Care, Other (non HMO) | Attending: Cardiovascular Disease

## 2019-10-15 ENCOUNTER — Other Ambulatory Visit: Payer: Self-pay

## 2019-10-15 DIAGNOSIS — R002 Palpitations: Secondary | ICD-10-CM

## 2019-10-15 DIAGNOSIS — R42 Dizziness and giddiness: Secondary | ICD-10-CM | POA: Diagnosis not present

## 2019-10-15 LAB — ECHOCARDIOGRAM COMPLETE
Area-P 1/2: 3.72 cm2
S' Lateral: 3 cm

## 2019-10-19 ENCOUNTER — Telehealth: Payer: Self-pay | Admitting: Cardiology

## 2019-10-19 NOTE — Telephone Encounter (Signed)
Patient returning Carly's call in regards to echo results. You can leave results on VM.

## 2019-10-19 NOTE — Telephone Encounter (Signed)
The patient has been notified of the result and verbalized understanding.  All questions (if any) were answered. Theresia Majors, RN 10/19/2019 4:25 PM

## 2019-10-19 NOTE — Progress Notes (Deleted)
CARDIOLOGY OFFICE NOTE  Date:  10/25/2019    Sheila Little Date of Birth: 09/02/1985 Medical Record #[id number]  PCP:  Richardean Chimera, MD  Cardiologist:  Senaida Ores chief complaint on file.   History of Present Illness: Sheila Little is a 34 y.o. female who presents today for a follow up visit.   Seen here last month for chest pain/palpitations/dizziness. She is currently pregnant with her 2nd child. Noted to have low TSH and was sent to Endocrine. FH strong for CAD.   Comes in today. Here with   Past Medical History:  Diagnosis Date  . CHEST WALL PAIN, ANTERIOR 08/02/2009   Qualifier: Diagnosis of  By: Leone Payor MD, Charlyne Quale EPIGASTRIC PAIN, CHRONIC 08/02/2009   Qualifier: Diagnosis of  By: Leone Payor MD, Charlyne Quale Nonspecific (abnormal) findings on radiological and other examination of other intrathoracic organs 07/24/2009   Centricity Description: ECHOCARDIOGRAM, ABNORMAL Qualifier: Diagnosis of  By: Gala Romney, MD, Trixie Dredge  Centricity Description: ABNORMAL ECHOCARDIOGRAM Qualifier: Diagnosis of  By: Gala Romney, MD, Trixie Dredge   . NSVD (normal spontaneous vaginal delivery) 10/30/2016  . Precordial pain 07/24/2009   Qualifier: Diagnosis of  By: Gala Romney, MD, Trixie Dredge Term pregnancy 10/29/2016    Past Surgical History:  Procedure Laterality Date  . WISDOM TOOTH EXTRACTION       Medications: No outpatient medications have been marked as taking for the 10/26/19 encounter (Appointment) with Rosalio Macadamia, NP.     Allergies: No Known Allergies  Social History: The patient  reports that she has never smoked. She has never used smokeless tobacco. She reports that she does not drink alcohol and does not use drugs.   Family History: The patient's ***family history includes Diabetes in her maternal grandmother; Heart disease in her maternal grandmother; Migraines in her mother; Osteoporosis in her maternal grandmother; Rheum  arthritis in her maternal grandmother.   Review of Systems: Please see the history of present illness.   All other systems are reviewed and negative.   Physical Exam: VS:  There were no vitals taken for this visit. Marland Kitchen  BMI There is no height or weight on file to calculate BMI.  Wt Readings from Last 3 Encounters:  09/02/19 139 lb 9.6 oz (63.3 kg)  10/29/16 150 lb (68 kg)  04/26/16 116 lb (52.6 kg)    General: Pleasant. Well developed, well nourished and in no acute distress.   HEENT: Normal.  Neck: Supple, no JVD, carotid bruits, or masses noted.  Cardiac: ***Regular rate and rhythm. No murmurs, rubs, or gallops. No edema.  Respiratory:  Lungs are clear to auscultation bilaterally with normal work of breathing.  GI: Soft and nontender.  MS: No deformity or atrophy. Gait and ROM intact.  Skin: Warm and dry. Color is normal.  Neuro:  Strength and sensation are intact and no gross focal deficits noted.  Psych: Alert, appropriate and with normal affect.   LABORATORY DATA:  EKG:  EKG {ACTION; IS/IS [id number]} ordered today.  Personally reviewed by me. This demonstrates ***.  Lab Results  Component Value Date   WBC 12.3 (H) 10/31/2016   HGB 9.0 (L) 10/31/2016   HCT 26.9 (L) 10/31/2016   PLT 131 (L) 10/31/2016   GLUCOSE 114 (H) 04/26/2016   ALT 22 04/26/2016   AST 38 04/26/2016   NA 135 04/26/2016   K 4.0 04/26/2016   CL 103 04/26/2016  CREATININE 0.61 04/26/2016   BUN 16 04/26/2016   CO2 21 (L) 04/26/2016       BNP (last 3 results) No results for input(s): BNP in the last 8760 hours.  ProBNP (last 3 results) No results for input(s): PROBNP in the last 8760 hours.   Other Studies Reviewed Today:  ECHO IMPRESSIONS 09/2019  1. Left ventricular ejection fraction, by estimation, is 60 to 65%. The  left ventricle has normal function. The left ventricle has no regional  wall motion abnormalities. Left ventricular diastolic parameters were  normal.  2. Right  ventricular systolic function is normal. The right ventricular  size is normal. There is normal pulmonary artery systolic pressure.  3. The mitral valve is normal in structure. Trivial mitral valve  regurgitation. No evidence of mitral stenosis.  4. The aortic valve is tricuspid. Aortic valve regurgitation is not  visualized. No aortic stenosis is present.  5. The inferior vena cava is normal in size with greater than 50%  respiratory variability, suggesting right atrial pressure of 3 mmHg.     Monitor Study Highlights 09/2019   Sinus bradycardia, normal sinus rhythm and sinus tachycardia. The average heart rate was 89bpm and ranged from 58-148bpm.  Nonsustained arial tachycardia up to 11 beats in a row.  Patient cited multiple episodes of palpitations that were noted to be sinus tachycardia  Quintella Reichert, MD  10/16/2019 1:45 PM EDT     Patient has several episodes of nonsustained atrial tach which are from an irritable place in upper heart chamber. Would like to avoid BB since she is only in second trimester. Most episodes of palpitations occurred with sinus tachycardia. Her overall average heart rate is normal. Please find out how bothersome the palpitations are for her     ASSESSMENT & PLAN:    1. Palpitations  1. Atypical chest pain -her description is not really pain and more an uneasy feeling when her heart is racing -she does have a fm hx on her mom's side of DCM and heart tx which could signal a hereditary CM -I will check a 2D echo to make sure LVF is normal  2.  Dizziness -she has a long hx of low BP and dizziness dating back to childhood with hx of syncope in the past -she may have a component of POTS syndrome -orthostatics in office today showed BP laying 96/4mmhg>>94/58mmHg upon initial standing and then 102/68mmhg after standing 5 minutes.  HR started at 83bpm and increased to 104bpm.   -I have encouraged her to liberalize her Na intake unless she  develops LE edema and increase fluid intake to at least 64oz daily -Avoid caffeine -may need to consider compression hose during the day  3.  Palpitations -these occur when her BP is low and likely are related to response to drop in BP but she also has times when palpitations occur suddenly and her heart feels like it is beating out of her chest -there was some concern over hyperthyroidism but she was seen by Endocrinology and felt to be normal for pregnancy -I will get a 2 week Ziopatch to assess for arrhythmias     Current medicines are reviewed with the patient today.  The patient does not have concerns regarding medicines other than what has been noted above.  The following changes have been made:  See above.  Labs/ tests ordered today include:   No orders of the defined types were placed in this encounter.    Disposition:  FU with *** in {gen number [id number]} {Days to years:10300}.   Patient is agreeable to this plan and will call if any problems develop in the interim.   SignedNorma Fredrickson, NP  10/25/2019 7:47 AM  [facility] [address] [place], [place]  [address] Phone: [phone] Fax: [phone]

## 2019-10-26 ENCOUNTER — Ambulatory Visit: Payer: Managed Care, Other (non HMO) | Admitting: Nurse Practitioner

## 2019-11-23 ENCOUNTER — Other Ambulatory Visit: Payer: Self-pay | Admitting: Surgery

## 2019-11-23 DIAGNOSIS — R1031 Right lower quadrant pain: Secondary | ICD-10-CM

## 2019-11-24 ENCOUNTER — Encounter (HOSPITAL_COMMUNITY): Payer: Self-pay | Admitting: Obstetrics and Gynecology

## 2019-11-24 ENCOUNTER — Inpatient Hospital Stay (HOSPITAL_COMMUNITY)
Admission: AD | Admit: 2019-11-24 | Discharge: 2019-11-24 | Disposition: A | Discharge status: Home / Self Care | Payer: Managed Care, Other (non HMO) | Attending: Obstetrics and Gynecology | Admitting: Obstetrics and Gynecology

## 2019-11-24 ENCOUNTER — Other Ambulatory Visit: Payer: Self-pay

## 2019-11-24 DIAGNOSIS — Z3A34 34 weeks gestation of pregnancy: Secondary | ICD-10-CM | POA: Diagnosis not present

## 2019-11-24 DIAGNOSIS — O4703 False labor before 37 completed weeks of gestation, third trimester: Secondary | ICD-10-CM

## 2019-11-24 DIAGNOSIS — O479 False labor, unspecified: Secondary | ICD-10-CM

## 2019-11-24 LAB — URINALYSIS, ROUTINE W REFLEX MICROSCOPIC
Bacteria, UA: NONE SEEN
Bilirubin Urine: NEGATIVE
Glucose, UA: >=500 mg/dL — AB
Hgb urine dipstick: NEGATIVE
Ketones, ur: NEGATIVE mg/dL
Nitrite: NEGATIVE
Protein, ur: NEGATIVE mg/dL
Specific Gravity, Urine: 1.003 — ABNORMAL LOW (ref 1.005–1.030)
pH: 6 (ref 5.0–8.0)

## 2019-11-24 LAB — AMNISURE RUPTURE OF MEMBRANE (ROM) NOT AT ARMC: Amnisure ROM: NEGATIVE

## 2019-11-24 NOTE — MAU Note (Signed)
Pt presents to MAU from OB office due to abdominal pain that started today around 1300 and she also noticed a small amount of clear fluid in underwear at 1430 today. Pt denies VB. +FM

## 2019-11-24 NOTE — Discharge Instructions (Signed)
Braxton Hicks Contractions Contractions of the uterus can occur throughout pregnancy, but they are not always a sign that you are in labor. You may have practice contractions called Braxton Hicks contractions. These false labor contractions are sometimes confused with true labor. What are Braxton Hicks contractions? Braxton Hicks contractions are tightening movements that occur in the muscles of the uterus before labor. Unlike true labor contractions, these contractions do not result in opening (dilation) and thinning of the cervix. Toward the end of pregnancy (32-34 weeks), Braxton Hicks contractions can happen more often and may become stronger. These contractions are sometimes difficult to tell apart from true labor because they can be very uncomfortable. You should not feel embarrassed if you go to the hospital with false labor. Sometimes, the only way to tell if you are in true labor is for your health care provider to look for changes in the cervix. The health care provider will do a physical exam and may monitor your contractions. If you are not in true labor, the exam should show that your cervix is not dilating and your water has not broken. If there are no other health problems associated with your pregnancy, it is completely safe for you to be sent home with false labor. You may continue to have Braxton Hicks contractions until you go into true labor. How to tell the difference between true labor and false labor True labor  Contractions last 30-70 seconds.  Contractions become very regular.  Discomfort is usually felt in the top of the uterus, and it spreads to the lower abdomen and low back.  Contractions do not go away with walking.  Contractions usually become more intense and increase in frequency.  The cervix dilates and gets thinner. False labor  Contractions are usually shorter and not as strong as true labor contractions.  Contractions are usually irregular.  Contractions  are often felt in the front of the lower abdomen and in the groin.  Contractions may go away when you walk around or change positions while lying down.  Contractions get weaker and are shorter-lasting as time goes on.  The cervix usually does not dilate or become thin. Follow these instructions at home:   Take over-the-counter and prescription medicines only as told by your health care provider.  Keep up with your usual exercises and follow other instructions from your health care provider.  Eat and drink lightly if you think you are going into labor.  If Braxton Hicks contractions are making you uncomfortable: ? Change your position from lying down or resting to walking, or change from walking to resting. ? Sit and rest in a tub of warm water. ? Drink enough fluid to keep your urine pale yellow. Dehydration may cause these contractions. ? Do slow and deep breathing several times an hour.  Keep all follow-up prenatal visits as told by your health care provider. This is important. Contact a health care provider if:  You have a fever.  You have continuous pain in your abdomen. Get help right away if:  Your contractions become stronger, more regular, and closer together.  You have fluid leaking or gushing from your vagina.  You pass blood-tinged mucus (bloody show).  You have bleeding from your vagina.  You have low back pain that you never had before.  You feel your baby's head pushing down and causing pelvic pressure.  Your baby is not moving inside you as much as it used to. Summary  Contractions that occur before labor are   called Braxton Hicks contractions, false labor, or practice contractions.  Braxton Hicks contractions are usually shorter, weaker, farther apart, and less regular than true labor contractions. True labor contractions usually become progressively stronger and regular, and they become more frequent.  Manage discomfort from Braxton Hicks contractions  by changing position, resting in a warm bath, drinking plenty of water, or practicing deep breathing. This information is not intended to replace advice given to you by your health care provider. Make sure you discuss any questions you have with your health care provider. Document Revised: 02/28/2017 Document Reviewed: 08/01/2016 Elsevier Patient Education  2020 [employer] Fetal Movement Counts Patient Name: ________________________________________________ Patient Due Date: ____________________ What is a fetal movement count?  A fetal movement count is the number of times that you feel your baby move during a certain amount of time. This may also be called a fetal kick count. A fetal movement count is recommended for every pregnant woman. You may be asked to start counting fetal movements as early as week 28 of your pregnancy. Pay attention to when your baby is most active. You may notice your baby's sleep and wake cycles. You may also notice things that make your baby move more. You should do a fetal movement count:  When your baby is normally most active.  At the same time each day. A good time to count movements is while you are resting, after having something to eat and drink. How do I count fetal movements? 1. Find a quiet, comfortable area. Sit, or lie down on your side. 2. Write down the date, the start time and stop time, and the number of movements that you felt between those two times. Take this information with you to your health care visits. 3. Write down your start time when you feel the first movement. 4. Count kicks, flutters, swishes, rolls, and jabs. You should feel at least 10 movements. 5. You may stop counting after you have felt 10 movements, or if you have been counting for 2 hours. Write down the stop time. 6. If you do not feel 10 movements in 2 hours, contact your health care provider for further instructions. Your health care provider may want to do additional tests  to assess your baby's well-being. Contact a health care provider if:  You feel fewer than 10 movements in 2 hours.  Your baby is not moving like he or she usually does. Date: ____________ Start time: ____________ Stop time: ____________ Movements: ____________ Date: ____________ Start time: ____________ Stop time: ____________ Movements: ____________ Date: ____________ Start time: ____________ Stop time: ____________ Movements: ____________ Date: ____________ Start time: ____________ Stop time: ____________ Movements: ____________ Date: ____________ Start time: ____________ Stop time: ____________ Movements: ____________ Date: ____________ Start time: ____________ Stop time: ____________ Movements: ____________ Date: ____________ Start time: ____________ Stop time: ____________ Movements: ____________ Date: ____________ Start time: ____________ Stop time: ____________ Movements: ____________ Date: ____________ Start time: ____________ Stop time: ____________ Movements: ____________ This information is not intended to replace advice given to you by your health care provider. Make sure you discuss any questions you have with your health care provider. Document Revised: 11/05/2018 Document Reviewed: 11/05/2018 Elsevier Patient Education  2020 Elsevier Inc.  

## 2019-11-24 NOTE — MAU Provider Note (Signed)
Chief Complaint:  Abdominal Pain   First Provider Initiated Contact with Patient 11/24/19 1713     HPI: Sheila Little is a 34 y.o. G2P1001 at [redacted]w[redacted]d who presents to maternity admissions reporting contractions. Was seen in the office today; reports contractions every 3-4 minutes; cervix was closed. Sent here for further evaluation. States she had a very small episode of leaking after her appointment but no leaking since then. No vaginal bleeding. States the contractions are somewhat uncomfortable. Good fetal movement.  Location: abdomen Quality: cramping Severity: 6/10 in pain scale Duration: 1 day Timing: intermittent Modifying factors: none Associated signs and symptoms: vaginal discharge  Pregnancy Course: [facility]  Past Medical History:  Diagnosis Date  . CHEST WALL PAIN, ANTERIOR 08/02/2009   Qualifier: Diagnosis of  By: Leone Payor MD, Charlyne Quale EPIGASTRIC PAIN, CHRONIC 08/02/2009   Qualifier: Diagnosis of  By: Leone Payor MD, Charlyne Quale Nonspecific (abnormal) findings on radiological and other examination of other intrathoracic organs 07/24/2009   Centricity Description: ECHOCARDIOGRAM, ABNORMAL Qualifier: Diagnosis of  By: Gala Romney, MD, Trixie Dredge  Centricity Description: ABNORMAL ECHOCARDIOGRAM Qualifier: Diagnosis of  By: Gala Romney, MD, Trixie Dredge Precordial pain 07/24/2009   Qualifier: Diagnosis of  By: Gala Romney, MD, Trixie Dredge    OB History  Gravida Para Term Preterm AB Living  2 1 1     1   SAB TAB Ectopic Multiple Live Births        0 1    # Outcome Date GA Lbr Len/2nd Weight Sex Delivery Anes PTL Lv  2 Current           1 Term 10/30/16 [redacted]w[redacted]d 15:47 / 00:46 3010 g F Vag-Spont EPI  LIV   Past Surgical History:  Procedure Laterality Date  . WISDOM TOOTH EXTRACTION     Family History  Problem Relation Age of Onset  . Migraines Mother   . Heart disease Maternal Grandmother   . Osteoporosis Maternal Grandmother   . Rheum arthritis  Maternal Grandmother   . Diabetes Maternal Grandmother    Social History   Tobacco Use  . Smoking status: Never Smoker  . Smokeless tobacco: Never Used  Substance Use Topics  . Alcohol use: No  . Drug use: No   Allergies  Allergen Reactions  . Sumatriptan    No medications prior to admission.    I have reviewed patient's Past Medical Hx, Surgical Hx, Family Hx, Social Hx, medications and allergies.   ROS:  Review of Systems  Constitutional: Negative.   Gastrointestinal: Positive for abdominal pain. Negative for diarrhea, nausea and vomiting.  Genitourinary: Negative.     Physical Exam   Patient Vitals for the past 24 hrs:  BP Temp Temp src Resp  11/24/19 1853 108/65 -- -- --  11/24/19 1651 109/62 97.9 F (36.6 C) Oral 18    Constitutional: Well-developed, well-nourished female in no acute distress.  Cardiovascular: normal rate & rhythm, no murmur Respiratory: normal effort, lung sounds clear throughout GI: Abd soft, non-tender, gravid appropriate for gestational age. Pos BS x 4 MS: Extremities nontender, no edema, normal ROM Neurologic: Alert and oriented x 4.  GU:   Cervix closed (ext 1 cm)/thick/soft  Fetal Tracing:  Baseline: 135 Variability: moderate Accelerations: 15x15 Decelerations: none  Toco: Q3-6 minutes    Labs: Results for orders placed or performed during the hospital encounter of 11/24/19 (from the past 24 hour(s))  Urinalysis, Routine w reflex microscopic  Urine, Clean Catch     Status: Abnormal   Collection Time: 11/24/19  5:09 PM  Result Value Ref Range   Color, Urine YELLOW YELLOW   APPearance CLEAR CLEAR   Specific Gravity, Urine 1.003 (L) 1.005 - 1.030   pH 6.0 5.0 - 8.0   Glucose, UA >=500 (A) NEGATIVE mg/dL   Hgb urine dipstick NEGATIVE NEGATIVE   Bilirubin Urine NEGATIVE NEGATIVE   Ketones, ur NEGATIVE NEGATIVE mg/dL   Protein, ur NEGATIVE NEGATIVE mg/dL   Nitrite NEGATIVE NEGATIVE   Leukocytes,Ua TRACE (A) NEGATIVE   WBC,  UA 0-5 0 - 5 WBC/hpf   Bacteria, UA NONE SEEN NONE SEEN   Squamous Epithelial / LPF 0-5 0 - 5  Amnisure rupture of membrane (rom)not at [facility]     Status: None   Collection Time: 11/24/19  5:41 PM  Result Value Ref Range   Amnisure ROM NEGATIVE     Imaging:  No results found.  MAU Course: Orders Placed This Encounter  Procedures  . Urinalysis, Routine w reflex microscopic Urine, Clean Catch  . Amnisure rupture of membrane (rom)not at [facility]  . Discharge patient   No orders of the defined types were placed in this encounter.   MDM: Reactive fetal tracing. Regular contractions on monitor. Palpate mild & patient does not appear to be uncomfortable. Cervix remains closed. Patient reassured & comfortable with going home.  Amnisure was performed and was negative. Speculum exam was deferred due to patient request. Fluid was likely gel from her previous cervical exam.   Assessment: 1. Braxton Hick's contraction   2. [redacted] weeks gestation of pregnancy     Plan: Discharge home in stable condition.  Labor precautions    Follow-up Information    [place] Follow up.   Contact information: [address] [place] [address] [phone]               Allergies as of 11/24/2019      Reactions   Sumatriptan       Medication List    TAKE these medications   prenatal multivitamin Tabs tablet Take 1 tablet by mouth daily at 12 noon.       Judeth Horn, NP 11/24/2019 7:39 PM

## 2019-11-25 ENCOUNTER — Ambulatory Visit
Admission: RE | Admit: 2019-11-25 | Discharge: 2019-11-25 | Disposition: A | Discharge status: Home / Self Care | Payer: Managed Care, Other (non HMO) | Source: Ambulatory Visit | Attending: Surgery | Admitting: Surgery

## 2019-11-25 DIAGNOSIS — R1031 Right lower quadrant pain: Secondary | ICD-10-CM

## 2019-12-02 ENCOUNTER — Other Ambulatory Visit: Payer: Self-pay | Admitting: Surgery

## 2019-12-02 DIAGNOSIS — R1030 Lower abdominal pain, unspecified: Secondary | ICD-10-CM

## 2019-12-02 LAB — OB RESULTS CONSOLE GBS: GBS: NEGATIVE

## 2019-12-16 ENCOUNTER — Other Ambulatory Visit: Payer: Self-pay | Admitting: Surgery

## 2019-12-27 ENCOUNTER — Encounter (HOSPITAL_COMMUNITY): Payer: Self-pay | Admitting: *Deleted

## 2019-12-27 ENCOUNTER — Telehealth (HOSPITAL_COMMUNITY): Payer: Self-pay | Admitting: *Deleted

## 2019-12-27 NOTE — H&P (Signed)
Sheila Little is a 34 y.o. female presenting for IOL at term. Pregnancy complicated by inguinal hernia. S/P general surgery consult>expectant management until PP. 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 OB History    Gravida  2   Para  1   Term  1   Preterm      AB      Living  1     SAB      TAB      Ectopic      Multiple  0   Live Births  1          Past Medical History:  Diagnosis Date  . CHEST WALL PAIN, ANTERIOR 08/02/2009   Qualifier: Diagnosis of  By: Leone Payor MD, Charlyne Quale EPIGASTRIC PAIN, CHRONIC 08/02/2009   Qualifier: Diagnosis of  By: Leone Payor MD, Charlyne Quale Nonspecific (abnormal) findings on radiological and other examination of other intrathoracic organs 07/24/2009   Centricity Description: ECHOCARDIOGRAM, ABNORMAL Qualifier: Diagnosis of  By: Gala Romney, MD, Trixie Dredge  Centricity Description: ABNORMAL ECHOCARDIOGRAM Qualifier: Diagnosis of  By: Gala Romney, MD, Trixie Dredge Precordial pain 07/24/2009   Qualifier: Diagnosis of  By: Gala Romney, MD, Trixie Dredge    Past Surgical History:  Procedure Laterality Date  . WISDOM TOOTH EXTRACTION     Family History: family history includes Migraines in her mother. Social History:  reports that she has never smoked. She has never used smokeless tobacco. She reports that she does not drink alcohol and does not use drugs.     Maternal Diabetes: No Genetic Screening: Normal Maternal Ultrasounds/Referrals: Normal Fetal Ultrasounds or other Referrals:  None Maternal Substance Abuse:  No Significant Maternal Medications:  None Significant Maternal Lab Results:  Group B Strep positive Other Comments:  None  Review of Systems  Constitutional: Negative for fever.  Eyes: Negative for visual disturbance.  Gastrointestinal: Negative for abdominal pain.  Neurological: Negative for headaches.   Maternal Medical History:  Fetal activity:  Perceived fetal activity is normal.        unknown if currently breastfeeding. Maternal Exam:  Abdomen: Fetal presentation: vertex     Physical Exam Cardiovascular:     Rate and Rhythm: Normal rate.  Pulmonary:     Effort: Pulmonary effort is normal.     Cx 3/60/-2   Prenatal labs: ABO, Rh: O/Positive/-- (02/23 0000) Antibody: Negative (02/23 0000) Rubella: Immune (02/23 0000) RPR: Nonreactive (02/23 0000)  HBsAg: Negative (02/23 0000)  HIV: Non-reactive (02/23 0000)  GBS: Negative/-- (09/02 0000)  GBS positive in urine 05/27/19  Assessment/Plan: 34 yo G2P1 at term IOL Will treat per GBBS protocol   Roselle Locus II 12/27/2019, 3:07 PM

## 2019-12-27 NOTE — Telephone Encounter (Signed)
Preadmission screen  

## 2019-12-28 ENCOUNTER — Other Ambulatory Visit (HOSPITAL_COMMUNITY): Payer: Managed Care, Other (non HMO)

## 2019-12-29 ENCOUNTER — Inpatient Hospital Stay (HOSPITAL_COMMUNITY): Payer: Managed Care, Other (non HMO)

## 2019-12-29 ENCOUNTER — Other Ambulatory Visit: Payer: Self-pay

## 2019-12-29 ENCOUNTER — Inpatient Hospital Stay (HOSPITAL_COMMUNITY)
Admission: AD | Admit: 2019-12-29 | Discharge: 2019-12-31 | DRG: 807 | Disposition: A | Discharge status: Home / Self Care | Payer: Managed Care, Other (non HMO) | Attending: Obstetrics and Gynecology | Admitting: Obstetrics and Gynecology

## 2019-12-29 DIAGNOSIS — O99824 Streptococcus B carrier state complicating childbirth: Secondary | ICD-10-CM | POA: Diagnosis present

## 2019-12-29 DIAGNOSIS — Z3490 Encounter for supervision of normal pregnancy, unspecified, unspecified trimester: Secondary | ICD-10-CM

## 2019-12-29 DIAGNOSIS — O26893 Other specified pregnancy related conditions, third trimester: Secondary | ICD-10-CM | POA: Diagnosis present

## 2019-12-29 DIAGNOSIS — Z3A39 39 weeks gestation of pregnancy: Secondary | ICD-10-CM

## 2019-12-29 DIAGNOSIS — Z20822 Contact with and (suspected) exposure to covid-19: Secondary | ICD-10-CM | POA: Diagnosis present

## 2019-12-29 LAB — CBC
HCT: 33.1 % — ABNORMAL LOW (ref 36.0–46.0)
Hemoglobin: 10.9 g/dL — ABNORMAL LOW (ref 12.0–15.0)
MCH: 31.5 pg (ref 26.0–34.0)
MCHC: 32.9 g/dL (ref 30.0–36.0)
MCV: 95.7 fL (ref 80.0–100.0)
Platelets: 171 10*3/uL (ref 150–400)
RBC: 3.46 MIL/uL — ABNORMAL LOW (ref 3.87–5.11)
RDW: 15.9 % — ABNORMAL HIGH (ref 11.5–15.5)
WBC: 10.4 10*3/uL (ref 4.0–10.5)
nRBC: 0 % (ref 0.0–0.2)

## 2019-12-29 LAB — TYPE AND SCREEN
ABO/RH(D): O POS
Antibody Screen: NEGATIVE

## 2019-12-29 LAB — RESPIRATORY PANEL BY RT PCR (FLU A&B, COVID)
Influenza A by PCR: NEGATIVE
Influenza B by PCR: NEGATIVE
SARS Coronavirus 2 by RT PCR: NEGATIVE

## 2019-12-29 MED ORDER — TERBUTALINE SULFATE 1 MG/ML IJ SOLN: 0.2500 mg | Freq: Once | INTRAMUSCULAR | Status: DC | PRN

## 2019-12-29 MED ORDER — OXYCODONE-ACETAMINOPHEN 5-325 MG PO TABS: 1.0000 | ORAL_TABLET | ORAL | Status: DC | PRN

## 2019-12-29 MED ORDER — OXYCODONE-ACETAMINOPHEN 5-325 MG PO TABS: 2.0000 | ORAL_TABLET | ORAL | Status: DC | PRN

## 2019-12-29 MED ORDER — LIDOCAINE HCL (PF) 1 % IJ SOLN: 30.0000 mL | INTRAMUSCULAR | Status: DC | PRN

## 2019-12-29 MED ORDER — ONDANSETRON HCL 4 MG/2ML IJ SOLN: 4.0000 mg | Freq: Four times a day (QID) | INTRAMUSCULAR | Status: DC | PRN

## 2019-12-29 MED ORDER — FENTANYL CITRATE (PF) 100 MCG/2ML IJ SOLN
50.0000 ug | INTRAMUSCULAR | Status: DC | PRN
  Administered 2019-12-30: 50 ug via INTRAVENOUS
  Administered 2019-12-30: 100 ug via INTRAVENOUS
  Filled 2019-12-29 (×2): qty 2

## 2019-12-29 MED ORDER — OXYTOCIN-SODIUM CHLORIDE 30-0.9 UT/500ML-% IV SOLN
2.5000 [IU]/h | INTRAVENOUS | Status: DC
  Filled 2019-12-29: qty 500

## 2019-12-29 MED ORDER — MISOPROSTOL 25 MCG QUARTER TABLET
25.0000 ug | ORAL_TABLET | ORAL | Status: DC | PRN
  Administered 2019-12-30: 25 ug via VAGINAL
  Filled 2019-12-29: qty 1

## 2019-12-29 MED ORDER — SODIUM CHLORIDE 0.9 % IV SOLN
5.0000 10*6.[IU] | Freq: Once | INTRAVENOUS | Status: AC
  Administered 2019-12-29: 5 10*6.[IU] via INTRAVENOUS
  Filled 2019-12-29: qty 5

## 2019-12-29 MED ORDER — FLEET ENEMA 7-19 GM/118ML RE ENEM: 1.0000 | ENEMA | RECTAL | Status: DC | PRN

## 2019-12-29 MED ORDER — ZOLPIDEM TARTRATE 5 MG PO TABS: 5.0000 mg | ORAL_TABLET | Freq: Every evening | ORAL | Status: DC | PRN

## 2019-12-29 MED ORDER — PENICILLIN G POT IN DEXTROSE 60000 UNIT/ML IV SOLN
3.0000 10*6.[IU] | INTRAVENOUS | Status: DC
  Administered 2019-12-30 (×2): 3 10*6.[IU] via INTRAVENOUS
  Filled 2019-12-29 (×2): qty 50

## 2019-12-29 MED ORDER — ACETAMINOPHEN 325 MG PO TABS: 650.0000 mg | ORAL_TABLET | ORAL | Status: DC | PRN

## 2019-12-29 MED ORDER — OXYTOCIN BOLUS FROM INFUSION
333.0000 mL | Freq: Once | INTRAVENOUS | Status: AC
  Administered 2019-12-30: 333 mL via INTRAVENOUS

## 2019-12-29 MED ORDER — LACTATED RINGERS IV SOLN: 500.0000 mL | INTRAVENOUS | Status: DC | PRN

## 2019-12-29 MED ORDER — LACTATED RINGERS IV SOLN: INTRAVENOUS | Status: DC

## 2019-12-29 MED ORDER — MISOPROSTOL 25 MCG QUARTER TABLET
ORAL_TABLET | ORAL | Status: AC
  Administered 2019-12-29: 25 ug via VAGINAL
  Filled 2019-12-29: qty 1

## 2019-12-29 MED ORDER — SOD CITRATE-CITRIC ACID 500-334 MG/5ML PO SOLN: 30.0000 mL | ORAL | Status: DC | PRN

## 2019-12-29 NOTE — Progress Notes (Signed)
NO change to H&P per patient history.  FHT cat one UCs irregular and mild Cx 3/30/post/-2 to -3/vtx  A/P: D/W patient IOL options          Will start misoprostol for 2 stage IOL

## 2019-12-30 ENCOUNTER — Encounter (HOSPITAL_COMMUNITY): Payer: Self-pay | Admitting: Obstetrics and Gynecology

## 2019-12-30 ENCOUNTER — Inpatient Hospital Stay (HOSPITAL_COMMUNITY): Payer: Managed Care, Other (non HMO) | Admitting: Anesthesiology

## 2019-12-30 LAB — RPR: RPR Ser Ql: NONREACTIVE

## 2019-12-30 MED ORDER — ONDANSETRON HCL 4 MG/2ML IJ SOLN: 4.0000 mg | INTRAMUSCULAR | Status: DC | PRN

## 2019-12-30 MED ORDER — BENZOCAINE-MENTHOL 20-0.5 % EX AERO
1.0000 "application " | INHALATION_SPRAY | CUTANEOUS | Status: DC | PRN
  Administered 2019-12-30: 1 via TOPICAL
  Filled 2019-12-30: qty 56

## 2019-12-30 MED ORDER — DIPHENHYDRAMINE HCL 50 MG/ML IJ SOLN: 12.5000 mg | INTRAMUSCULAR | Status: DC | PRN

## 2019-12-30 MED ORDER — ACETAMINOPHEN 325 MG PO TABS
650.0000 mg | ORAL_TABLET | ORAL | Status: DC | PRN
  Administered 2019-12-30: 650 mg via ORAL
  Filled 2019-12-30: qty 2

## 2019-12-30 MED ORDER — FENTANYL CITRATE (PF) 2500 MCG/50ML IJ SOLN
INTRAMUSCULAR | Status: DC | PRN
Start: 2019-12-30 — End: 2019-12-30
  Administered 2019-12-30: 12 mL/h via EPIDURAL

## 2019-12-30 MED ORDER — LACTATED RINGERS IV SOLN: 500.0000 mL | Freq: Once | INTRAVENOUS | Status: DC

## 2019-12-30 MED ORDER — PRENATAL MULTIVITAMIN CH
1.0000 | ORAL_TABLET | Freq: Every day | ORAL | Status: DC
  Administered 2019-12-31: 1 via ORAL
  Filled 2019-12-30: qty 1

## 2019-12-30 MED ORDER — PHENYLEPHRINE 40 MCG/ML (10ML) SYRINGE FOR IV PUSH (FOR BLOOD PRESSURE SUPPORT): 80.0000 ug | PREFILLED_SYRINGE | INTRAVENOUS | Status: DC | PRN

## 2019-12-30 MED ORDER — OXYCODONE-ACETAMINOPHEN 5-325 MG PO TABS: 1.0000 | ORAL_TABLET | ORAL | Status: DC | PRN

## 2019-12-30 MED ORDER — EPHEDRINE 5 MG/ML INJ: 10.0000 mg | INTRAVENOUS | Status: DC | PRN

## 2019-12-30 MED ORDER — TETANUS-DIPHTH-ACELL PERTUSSIS 5-2.5-18.5 LF-MCG/0.5 IM SUSP: 0.5000 mL | Freq: Once | INTRAMUSCULAR | Status: DC

## 2019-12-30 MED ORDER — ONDANSETRON HCL 4 MG PO TABS: 4.0000 mg | ORAL_TABLET | ORAL | Status: DC | PRN

## 2019-12-30 MED ORDER — COCONUT OIL OIL: 1.0000 "application " | TOPICAL_OIL | Status: DC | PRN

## 2019-12-30 MED ORDER — SENNOSIDES-DOCUSATE SODIUM 8.6-50 MG PO TABS
2.0000 | ORAL_TABLET | ORAL | Status: DC
  Administered 2019-12-31: 2 via ORAL
  Filled 2019-12-30: qty 2

## 2019-12-30 MED ORDER — MEDROXYPROGESTERONE ACETATE 150 MG/ML IM SUSP: 150.0000 mg | INTRAMUSCULAR | Status: DC | PRN

## 2019-12-30 MED ORDER — ZOLPIDEM TARTRATE 5 MG PO TABS: 5.0000 mg | ORAL_TABLET | Freq: Every evening | ORAL | Status: DC | PRN

## 2019-12-30 MED ORDER — DIBUCAINE (PERIANAL) 1 % EX OINT: 1.0000 "application " | TOPICAL_OINTMENT | CUTANEOUS | Status: DC | PRN

## 2019-12-30 MED ORDER — OXYCODONE-ACETAMINOPHEN 5-325 MG PO TABS: 2.0000 | ORAL_TABLET | ORAL | Status: DC | PRN

## 2019-12-30 MED ORDER — SIMETHICONE 80 MG PO CHEW: 80.0000 mg | CHEWABLE_TABLET | ORAL | Status: DC | PRN

## 2019-12-30 MED ORDER — MEASLES, MUMPS & RUBELLA VAC IJ SOLR: 0.5000 mL | Freq: Once | INTRAMUSCULAR | Status: DC

## 2019-12-30 MED ORDER — IBUPROFEN 600 MG PO TABS
600.0000 mg | ORAL_TABLET | Freq: Four times a day (QID) | ORAL | Status: DC
  Administered 2019-12-30 – 2019-12-31 (×4): 600 mg via ORAL
  Filled 2019-12-30 (×4): qty 1

## 2019-12-30 MED ORDER — WITCH HAZEL-GLYCERIN EX PADS: 1.0000 "application " | MEDICATED_PAD | CUTANEOUS | Status: DC | PRN

## 2019-12-30 MED ORDER — FENTANYL-BUPIVACAINE-NACL 0.5-0.125-0.9 MG/250ML-% EP SOLN
12.0000 mL/h | EPIDURAL | Status: DC | PRN
  Filled 2019-12-30: qty 250

## 2019-12-30 MED ORDER — LIDOCAINE HCL (PF) 1 % IJ SOLN
INTRAMUSCULAR | Status: DC | PRN
  Administered 2019-12-30: 10 mL via EPIDURAL

## 2019-12-30 MED ORDER — DIPHENHYDRAMINE HCL 25 MG PO CAPS: 25.0000 mg | ORAL_CAPSULE | Freq: Four times a day (QID) | ORAL | Status: DC | PRN

## 2019-12-30 NOTE — Anesthesia Procedure Notes (Signed)
Epidural Patient location during procedure: OB Start time: 12/30/2019 10:03 AM End time: 12/30/2019 10:05 AM  Staffing Anesthesiologist: Leilani Able, MD Performed: anesthesiologist   Preanesthetic Checklist Completed: patient identified, IV checked, site marked, risks and benefits discussed, surgical consent, monitors and equipment checked, pre-op evaluation and timeout performed  Epidural Patient position: sitting Prep: DuraPrep and site prepped and draped Patient monitoring: continuous pulse ox and blood pressure Approach: midline Location: L3-L4 Injection technique: LOR air  Needle:  Needle type: Tuohy  Needle gauge: 17 G Needle length: 9 cm and 9 Needle insertion depth: 5 cm cm Catheter type: closed end flexible Catheter size: 19 Gauge Catheter at skin depth: 10 cm Test dose: negative and Other  Assessment Events: blood not aspirated, injection not painful, no injection resistance, no paresthesia and negative IV test  Additional Notes Reason for block:procedure for pain

## 2019-12-30 NOTE — Progress Notes (Signed)
Patient ID: Sheila Little, female   DOB: [birth year], 34 y.o.   MRN: [id number] Pt reports increase pain with ctxs  VSSAF FHR 140s with accels Ctxs q 3-5 minutes Cx 4/70/-2 AROM clear  IUP at 39 weeks AROM and pit.  Anticipate SVD Already on abx for GBS

## 2019-12-30 NOTE — Anesthesia Preprocedure Evaluation (Signed)
Anesthesia Evaluation  Patient identified by MRN, date of birth, ID band Patient awake    Reviewed: Allergy & Precautions, H&P , Patient's Chart, lab work & pertinent test results  Airway Mallampati: I  TM Distance: >3 FB Neck ROM: full    Dental no notable dental hx.    Pulmonary neg pulmonary ROS,    Pulmonary exam normal breath sounds clear to auscultation       Cardiovascular Exercise Tolerance: Good Normal cardiovascular exam Rhythm:regular Rate:Normal     Neuro/Psych  Headaches, negative psych ROS   GI/Hepatic negative GI ROS, Neg liver ROS,   Endo/Other  negative endocrine ROS  Renal/GU negative Renal ROS  negative genitourinary   Musculoskeletal negative musculoskeletal ROS (+)   Abdominal Normal abdominal exam  (+)   Peds  Hematology  (+) anemia ,   Anesthesia Other Findings   Reproductive/Obstetrics                             Anesthesia Physical  Anesthesia Plan  ASA: II  Anesthesia Plan: Epidural   Post-op Pain Management:    Induction:   PONV Risk Score and Plan:   Airway Management Planned:   Additional Equipment:   Intra-op Plan:   Post-operative Plan:   Informed Consent: I have reviewed the patients History and Physical, chart, labs and discussed the procedure including the risks, benefits and alternatives for the proposed anesthesia with the patient or authorized representative who has indicated his/her understanding and acceptance.       Plan Discussed with:   Anesthesia Plan Comments:         Anesthesia Quick Evaluation

## 2019-12-31 ENCOUNTER — Encounter (HOSPITAL_COMMUNITY): Payer: Self-pay | Admitting: Obstetrics and Gynecology

## 2019-12-31 LAB — CBC
HCT: 32.2 % — ABNORMAL LOW (ref 36.0–46.0)
Hemoglobin: 10.6 g/dL — ABNORMAL LOW (ref 12.0–15.0)
MCH: 31.6 pg (ref 26.0–34.0)
MCHC: 32.9 g/dL (ref 30.0–36.0)
MCV: 96.1 fL (ref 80.0–100.0)
Platelets: 160 10*3/uL (ref 150–400)
RBC: 3.35 MIL/uL — ABNORMAL LOW (ref 3.87–5.11)
RDW: 15.9 % — ABNORMAL HIGH (ref 11.5–15.5)
WBC: 12.8 10*3/uL — ABNORMAL HIGH (ref 4.0–10.5)
nRBC: 0 % (ref 0.0–0.2)

## 2019-12-31 NOTE — Lactation Note (Signed)
This note was copied from a baby's chart. Lactation Consultation Note  Patient Name: Sheila Little [id number] Date: 12/31/2019 Reason for consult: Initial assessment;Other (Comment);Term  Infant is 2 weeks 24 hours old with a 3% weight loss. Infant had 4 voids and 4 stools. S/P circumcision infant has been sleepy and not able to latch. LC assisted Mom changing hold from cradle to cross cradle to sustain the latch and provide stimulation with signs of milk transfer. Infant more active at the breast nursed for 10 minutes burped and switched to the opposite breast. Mom still nursing at the end of the visit.   Mom's nipples are both erect with no signs of abrasion or nipple trauma. Mom states she has no pain with the latch. At this time, Mom has not started pumping but she has a manual and [employer] at home. LC talked with Mom about supplementation and we reviewed the breastfeeding supplementation guide. Mom is comfortable with hand expression and spoon feeding at the end of each feed.   Mom using coconut oil and comfort gels for care. She understands that she cannot use them together and to d/c comfort gels after 6 days.   Plan 1. Feed based on cues 8-12 x in 24 hour period, no more than 3 hours without attempt.           2. Mom to offer both breasts and look for signs of milk transfer. Mom to supplement EBM according to guideline using a spoon.           3. Parents to monitor I/O's using the flowsheet.            4. Signs, symptoms and prevention of engorgement reviewed.             5. LC outpatient and inpatient services reviewed.             6. Parents have a f/u appointment with Pediatrics.

## 2019-12-31 NOTE — Discharge Summary (Signed)
Postpartum Discharge Summary  Date of Service updated 12/31/19     Patient Name: Sheila Little DOB: 28-Sep-1985 MRN: [id number]  Date of admission: 12/29/2019 Delivery date:12/30/2019  Delivering provider: Louretta Shorten  Date of discharge: 12/31/2019  Admitting diagnosis: Term pregnancy [Z34.90] Intrauterine pregnancy: [redacted]w[redacted]d     Secondary diagnosis:  Active Problems:   Term pregnancy  Additional problems: None    Discharge diagnosis: Term Pregnancy Delivered                                              Post partum procedures:none Augmentation: AROM, Pitocin and Cytotec Complications: None  Hospital course: Induction of Labor With Vaginal Delivery   34 y.o. yo G2P2002 at [redacted]w[redacted]d was admitted to the hospital 12/29/2019 for induction of labor.  Indication for induction: Favorable cervix at term.  Patient had an uncomplicated labor course as follows: Membrane Rupture Time/Date: 8:59 AM ,12/30/2019   Delivery Method:Vaginal, Spontaneous  Episiotomy: None  Lacerations:  1st degree;Perineal  Details of delivery can be found in separate delivery note.  Patient had a routine postpartum course. Patient is discharged home 12/31/19.  Newborn Data: Birth date:12/30/2019  Birth time:10:50 AM  Gender:Female  Living status:Living  Apgars:8 ,9  Weight:4045 g   Magnesium Sulfate received: No BMZ received: No Rhophylac:N/A MMR:N/A T-DaP:Given prenatally Flu: N/A Transfusion:No  Physical exam  Vitals:   12/30/19 1705 12/30/19 2012 12/31/19 0017 12/31/19 0500  BP: 106/72 104/69 115/72 104/68  Pulse: 70 80 63 64  Resp: $Remo'18 18 18 18  'vSqGy$ Temp: 98.3 F (36.8 C) 97.9 F (36.6 C) 98.4 F (36.9 C) 97.7 F (36.5 C)  TempSrc: Oral Oral Oral Oral  Weight:      Height:       General: alert Lochia: appropriate Uterine Fundus: firm Incision: Healing well with no significant drainage DVT Evaluation: No evidence of DVT seen on physical exam. Labs: Lab Results  Component Value Date   WBC  12.8 (H) 12/31/2019   HGB 10.6 (L) 12/31/2019   HCT 32.2 (L) 12/31/2019   MCV 96.1 12/31/2019   PLT 160 12/31/2019   CMP Latest Ref Rng & Units 04/26/2016  Glucose 65 - 99 mg/dL 114(H)  BUN 6 - 20 mg/dL 16  Creatinine 0.44 - 1.00 mg/dL 0.61  Sodium 135 - 145 mmol/L 135  Potassium 3.5 - 5.1 mmol/L 4.0  Chloride 101 - 111 mmol/L 103  CO2 22 - 32 mmol/L 21(L)  Calcium 8.9 - 10.3 mg/dL 8.5(L)  Total Protein 6.5 - 8.1 g/dL 6.7  Total Bilirubin 0.3 - 1.2 mg/dL 1.5(H)  Alkaline Phos 38 - 126 U/L 47  AST 15 - 41 U/L 38  ALT 14 - 54 U/L 22   Edinburgh Score: Edinburgh Postnatal Depression Scale Screening Tool 12/30/2019  I have been able to laugh and see the funny side of things. 0  I have looked forward with enjoyment to things. 0  I have blamed myself unnecessarily when things went wrong. 0  I have been anxious or worried for no good reason. 0  I have felt scared or panicky for no good reason. 0  Things have been getting on top of me. 0  I have been so unhappy that I have had difficulty sleeping. 0  I have felt sad or miserable. 0  I have been so unhappy that I have been crying.  0  The thought of harming myself has occurred to me. 0  Edinburgh Postnatal Depression Scale Total 0      After visit meds:  Allergies as of 12/31/2019      Reactions   Sumatriptan       Medication List    TAKE these medications   ferrous sulfate 325 (65 FE) MG tablet Take 325 mg by mouth daily with breakfast.   prenatal multivitamin Tabs tablet Take 1 tablet by mouth daily at 12 noon.      D/W patient female infant circumcision, risks/benefits reviewed. All questions answered.   Discharge home in stable condition Infant Feeding: Breast Infant Disposition:home with mother Discharge instruction: per After Visit Summary and Postpartum booklet. Activity: Advance as tolerated. Pelvic rest for 6 weeks.  Diet: routine diet Anticipated Birth Control: Unsure Postpartum Appointment:6  weeks Additional Postpartum F/U: n/a Future Appointments:No future appointments. Follow up Visit:      12/31/2019 Tyson Dense, MD

## 2019-12-31 NOTE — Discharge Instructions (Signed)

## 2019-12-31 NOTE — Anesthesia Postprocedure Evaluation (Signed)
Anesthesia Post Note  Patient: Sheila Little  Procedure(s) Performed: AN AD HOC LABOR EPIDURAL     Patient location during evaluation: Mother Baby Anesthesia Type: Epidural Level of consciousness: awake, awake and alert and oriented Pain management: pain level controlled Vital Signs Assessment: post-procedure vital signs reviewed and stable Respiratory status: spontaneous breathing and nonlabored ventilation Cardiovascular status: blood pressure returned to baseline and stable Postop Assessment: no headache, no backache, patient able to bend at knees, able to ambulate, adequate PO intake and no apparent nausea or vomiting Anesthetic complications: no Comments: Pt received epidural close to deliver. Pt endorses the epidural did not have time to set up. Pt encouraged to call for epidural early with subsequent deliveries.    No complications documented.  Last Vitals:  Vitals:   12/31/19 0017 12/31/19 0500  BP: 115/72 104/68  Pulse: 63 64  Resp: 18 18  Temp: 36.9 C 36.5 C    Last Pain:  Vitals:   12/31/19 0550  TempSrc:   PainSc: 2    Pain Goal:                   Jennelle Human

## 2020-01-04 ENCOUNTER — Inpatient Hospital Stay (HOSPITAL_COMMUNITY): Payer: Managed Care, Other (non HMO)

## 2020-01-09 ENCOUNTER — Telehealth (HOSPITAL_COMMUNITY): Payer: Self-pay | Admitting: Lactation Services

## 2020-01-09 NOTE — Telephone Encounter (Signed)
LC returned call and received the answering machine and left a message for mom to call [facility] services back - [phone].

## 2020-09-07 NOTE — Telephone Encounter (Signed)
See note in Contact box

## 2023-10-19 ENCOUNTER — Observation Stay (HOSPITAL_COMMUNITY)
Admission: EM | Admit: 2023-10-19 | Discharge: 2023-10-21 | Disposition: A | Discharge status: Home / Self Care | Attending: Internal Medicine | Admitting: Internal Medicine

## 2023-10-19 ENCOUNTER — Other Ambulatory Visit: Payer: Self-pay

## 2023-10-19 ENCOUNTER — Emergency Department (HOSPITAL_COMMUNITY)

## 2023-10-19 DIAGNOSIS — R7401 Elevation of levels of liver transaminase levels: Secondary | ICD-10-CM | POA: Diagnosis not present

## 2023-10-19 DIAGNOSIS — K810 Acute cholecystitis: Secondary | ICD-10-CM | POA: Diagnosis not present

## 2023-10-19 DIAGNOSIS — K219 Gastro-esophageal reflux disease without esophagitis: Secondary | ICD-10-CM | POA: Insufficient documentation

## 2023-10-19 DIAGNOSIS — R1013 Epigastric pain: Secondary | ICD-10-CM | POA: Diagnosis present

## 2023-10-19 DIAGNOSIS — K8050 Calculus of bile duct without cholangitis or cholecystitis without obstruction: Secondary | ICD-10-CM

## 2023-10-19 DIAGNOSIS — R079 Chest pain, unspecified: Secondary | ICD-10-CM | POA: Diagnosis present

## 2023-10-19 DIAGNOSIS — E871 Hypo-osmolality and hyponatremia: Secondary | ICD-10-CM | POA: Insufficient documentation

## 2023-10-19 LAB — HCG, QUANTITATIVE, PREGNANCY: hCG, Beta Chain, Quant, S: <1 m[IU]/mL (ref <5)

## 2023-10-19 LAB — BASIC METABOLIC PANEL WITH GFR
Anion gap: 10 (ref 5–15)
BUN: 15 mg/dL (ref 6–20)
CO2: 22 mmol/L (ref 22–32)
Calcium: 8.3 mg/dL — ABNORMAL LOW (ref 8.9–10.3)
Chloride: 100 mmol/L (ref 98–111)
Creatinine, Ser: 1.04 mg/dL — ABNORMAL HIGH (ref 0.44–1.00)
GFR, Estimated: >60 mL/min (ref >60)
Glucose, Bld: 95 mg/dL (ref 70–99)
Potassium: 3.6 mmol/L (ref 3.5–5.1)
Sodium: 132 mmol/L — ABNORMAL LOW (ref 135–145)

## 2023-10-19 LAB — CBC
HCT: 34.3 % — ABNORMAL LOW (ref 36.0–46.0)
Hemoglobin: 11.8 g/dL — ABNORMAL LOW (ref 12.0–15.0)
MCH: 31.5 pg (ref 26.0–34.0)
MCHC: 34.4 g/dL (ref 30.0–36.0)
MCV: 91.5 fL (ref 80.0–100.0)
Platelets: 181 K/uL (ref 150–400)
RBC: 3.75 MIL/uL — ABNORMAL LOW (ref 3.87–5.11)
RDW: 13.1 % (ref 11.5–15.5)
WBC: 10.5 K/uL (ref 4.0–10.5)
nRBC: 0 % (ref 0.0–0.2)

## 2023-10-19 LAB — POC URINE PREG, ED: Preg Test, Ur: NEGATIVE

## 2023-10-19 LAB — URINALYSIS, ROUTINE W REFLEX MICROSCOPIC
Bilirubin Urine: NEGATIVE
Glucose, UA: NEGATIVE mg/dL
Hgb urine dipstick: NEGATIVE
Ketones, ur: NEGATIVE mg/dL
Leukocytes,Ua: NEGATIVE
Nitrite: NEGATIVE
Protein, ur: NEGATIVE mg/dL
Specific Gravity, Urine: 1.005 (ref 1.005–1.030)
pH: 8 (ref 5.0–8.0)

## 2023-10-19 LAB — HEPATIC FUNCTION PANEL
ALT: 76 U/L — ABNORMAL HIGH (ref 0–44)
AST: 129 U/L — ABNORMAL HIGH (ref 15–41)
Albumin: 3.5 g/dL (ref 3.5–5.0)
Alkaline Phosphatase: 70 U/L (ref 38–126)
Bilirubin, Direct: 0.2 mg/dL (ref 0.0–0.2)
Indirect Bilirubin: 0.6 mg/dL (ref 0.3–0.9)
Total Bilirubin: 0.8 mg/dL (ref 0.0–1.2)
Total Protein: 6.3 g/dL — ABNORMAL LOW (ref 6.5–8.1)

## 2023-10-19 LAB — LIPASE, BLOOD: Lipase: 35 U/L (ref 11–51)

## 2023-10-19 LAB — TROPONIN I (HIGH SENSITIVITY): Troponin I (High Sensitivity): <2 ng/L (ref <18)

## 2023-10-19 MED ORDER — PIPERACILLIN-TAZOBACTAM 3.375 G IVPB 30 MIN
3.3750 g | Freq: Once | INTRAVENOUS | Status: AC
  Administered 2023-10-20: 3.375 g via INTRAVENOUS
  Filled 2023-10-19: qty 50

## 2023-10-19 MED ORDER — IOHEXOL 300 MG/ML  SOLN
100.0000 mL | Freq: Once | INTRAMUSCULAR | Status: AC | PRN
  Administered 2023-10-19: 100 mL via INTRAVENOUS

## 2023-10-19 MED ORDER — SODIUM CHLORIDE 0.9 % IV BOLUS
1000.0000 mL | Freq: Once | INTRAVENOUS | Status: AC
  Administered 2023-10-19: 1000 mL via INTRAVENOUS

## 2023-10-19 MED ORDER — ONDANSETRON HCL 4 MG/2ML IJ SOLN
4.0000 mg | Freq: Once | INTRAMUSCULAR | Status: AC
  Administered 2023-10-19: 4 mg via INTRAVENOUS
  Filled 2023-10-19: qty 2

## 2023-10-19 NOTE — ED Provider Notes (Signed)
 [facility] EMERGENCY DEPARTMENT AT [facility] Provider Note   CSN: [id number] Arrival date & time: 10/19/23  2010     Patient presents with: Chest Pain   Sheila Little is a 38 y.o. female.   Patient is a 38 year old female who presents to the Emergency Department with a chief complaint of sudden onset of epigastric abdominal pain with radiation to her back which began at approximately 1800 today.  Patient notes that the pain did radiate up into her chest.  She notes that there was some associated shortness of breath when the event occurred.  She denies any associated diarrhea or constipation but notes that she did have associated nausea and vomiting with the pain.  There is been no associated dizziness, lightheadedness or syncope.  She denies any recent falls or blunt abdominal wall trauma.   Chest Pain Associated symptoms: abdominal pain, nausea and vomiting        Prior to Admission medications   Medication Sig Start Date End Date Taking? Authorizing Provider  cetirizine (ZYRTEC) 10 MG tablet Take 10 mg by mouth daily as needed for allergies.    [provider]  ferrous sulfate 325 (65 FE) MG tablet Take 325 mg by mouth daily with breakfast.    [provider]  Prenatal Vit-Fe Fumarate-FA (PRENATAL MULTIVITAMIN) TABS tablet Take 1 tablet by mouth daily at 12 noon.    [provider]    Allergies: Sumatriptan    Review of Systems  Cardiovascular:  Positive for chest pain.  Gastrointestinal:  Positive for abdominal pain, nausea and vomiting.    Updated Vital Signs BP (!) 111/95 (BP Location: Left Arm)   Pulse 96   Temp 98.4 F (36.9 C) (Oral)   Resp (!) 24   Ht 5' 8 (1.727 m)   Wt 59 kg   LMP 10/19/2023   SpO2 100%   BMI 19.77 kg/m   Physical Exam Vitals and nursing note reviewed.  Constitutional:      General: She is not in acute distress.    Appearance: Normal appearance. She is not ill-appearing.  HENT:     Head:  Normocephalic and atraumatic.     Nose: Nose normal.     Mouth/Throat:     Mouth: Mucous membranes are moist.  Eyes:     Extraocular Movements: Extraocular movements intact.     Conjunctiva/sclera: Conjunctivae normal.     Pupils: Pupils are equal, round, and reactive to light.  Cardiovascular:     Rate and Rhythm: Normal rate and regular rhythm.     Pulses: Normal pulses.     Heart sounds: Normal heart sounds. Heart sounds not distant. No murmur heard. Pulmonary:     Effort: Pulmonary effort is normal.     Breath sounds: Normal breath sounds. No decreased breath sounds, wheezing, rhonchi or rales.  Chest:     Chest wall: No tenderness.  Abdominal:     General: Abdomen is flat. Bowel sounds are normal.     Palpations: Abdomen is soft. There is no mass.     Tenderness: There is no guarding.     Comments: Tenderness palpation to epigastric region  Musculoskeletal:        General: Normal range of motion.     Cervical back: Normal range of motion and neck supple.     Right lower leg: No edema.     Left lower leg: No edema.  Skin:    General: Skin is warm and dry.  Neurological:  General: No focal deficit present.     Mental Status: She is alert and oriented to person, place, and time. Mental status is at baseline.  Psychiatric:        Mood and Affect: Mood normal.        Behavior: Behavior normal.        Thought Content: Thought content normal.        Judgment: Judgment normal.     (all labs ordered are listed, but only abnormal results are displayed) Labs Reviewed  BASIC METABOLIC PANEL WITH GFR - Abnormal; Notable for the following components:      Result Value   Sodium 132 (*)    Creatinine, Ser 1.04 (*)    Calcium 8.3 (*)    All other components within normal limits  CBC - Abnormal; Notable for the following components:   RBC 3.75 (*)    Hemoglobin 11.8 (*)    HCT 34.3 (*)    All other components within normal limits  HEPATIC FUNCTION PANEL - Abnormal; Notable  for the following components:   Total Protein 6.3 (*)    AST 129 (*)    ALT 76 (*)    All other components within normal limits  URINALYSIS, ROUTINE W REFLEX MICROSCOPIC - Abnormal; Notable for the following components:   Color, Urine STRAW (*)    All other components within normal limits  HCG, QUANTITATIVE, PREGNANCY  LIPASE, BLOOD  POC URINE PREG, ED  TROPONIN I (HIGH SENSITIVITY)  TROPONIN I (HIGH SENSITIVITY)    EKG: None  Radiology: No results found.   Procedures   Medications Ordered in the ED  iohexol  (OMNIPAQUE ) 300 MG/ML solution 100 mL (has no administration in time range)  sodium chloride  0.9 % bolus 1,000 mL (0 mLs Intravenous Stopped 10/19/23 2238)  ondansetron  (ZOFRAN ) injection 4 mg (4 mg Intravenous Given 10/19/23 2105)                                    Medical Decision Making Amount and/or Complexity of Data Reviewed Labs: ordered. Radiology: ordered.  Risk Prescription drug management. Decision regarding hospitalization.   This patient presents to the ED for concern of epigastric pain, right upper quadrant pain, chest pain, this involves an extensive number of treatment options, and is a complaint that carries with it a high risk of complications and morbidity.  The differential diagnosis includes acute appendicitis, cholecystitis, bowel torsion, diverticulitis, ovarian torsion cyst, PID, to an abscess, pyelonephritis, kidney stone, pancreatitis, history of ischemia, ACS, pulmonary embolus, pericarditis, myocarditis   Co morbidities that complicate the patient evaluation  None   Additional history obtained:  Additional history obtained from none External records from outside source obtained and reviewed including none   Lab Tests:  I Ordered, and personally interpreted labs.  The pertinent results include: No leukocytosis, mild anemia, mild hyponatremia, mild elevation in creatinine, elevated AST and ALT, normal bilirubin, negative lipase,  unremarkable urinalysis, negative troponin   Imaging Studies ordered:  I ordered imaging studies including CT scan of the abdomen pelvis I independently visualized and interpreted imaging which showed gallbladder wall thickening with pericholecystic fluid and cholelithiasis I agree with the radiologist interpretation   Cardiac Monitoring: / EKG:  The patient was maintained on a cardiac monitor.  I personally viewed and interpreted the cardiac monitored which showed an underlying rhythm of: Normal sinus rhythm, no ST/T wave changes, no ischemic changes, no STEMI  Consultations Obtained:  I requested consultation with the general surgery, Dr. Evonnie,  and discussed lab and imaging findings as well as pertinent plan - they recommend: Admission to hospital service, right upper quadrant ultrasound   Problem List / ED Course / Critical interventions / Medication management  Patient is doing well at this time and does remain stable.  Pain has improved at this point but she does remain tender in the epigastric region.  Discussed with patient CT scan findings are concerning for acute cholecystitis.  Did discuss patient case with general surgery on-call who did recommend mission to the hospital service and right upper quadrant ultrasound in the morning.  Patient does have stable blood work otherwise at this point.  She has no findings suggest choledocholithiasis or cholangitis at this point.  Have discussed patient case with Dr. Shona with the hospitalist service who has excepted for admission. I ordered medication including Zosyn , IV fluids, Zofran  for abdominal pain, acute cholecystitis Reevaluation of the patient after these medicines showed that the patient improved I have reviewed the patients home medicines and have made adjustments as needed   Social Determinants of Health:  None   Test / Admission - Considered:  Admission     Final diagnoses:  None    ED Discharge Orders      None          Daralene Lonni JONETTA DEVONNA 10/19/23 2347    Suzette Pac, MD 10/21/23 1344

## 2023-10-19 NOTE — ED Notes (Signed)
 Pt unable to urinate at this time.

## 2023-10-19 NOTE — H&P (Signed)
 History and Physical  Sheila Little FMW:[id number] DOB: [birth year] DOA: 10/19/2023  Referring physician: Daralene Barefoot, PA-EDP  PCP: Sheila Jerel MATSU, MD  Outpatient Specialists: None Patient coming from: Home  Chief Complaint: Right upper quadrant abdominal pain.  HPI: Sheila Little is a 38 y.o. female with no significant medical history who presents to the ER due to severe right upper quadrant abdominal pain.  Symptoms started about 2 months ago and have been intermittent.  Associated with chills for the past 24 hours.  No vomiting or diarrhea.  In the ER, tachypneic.  CT abdomen and pelvis with contrast revealed gallbladder wall thickening and pericholecystic fluid.  Question cholelithiasis.  Recommend right upper quadrant ultrasound for further evaluation.  Stool throughout the colon correlate for constipation.  Hepatomegaly.  AST and ALT elevated.  Symptoms are improved at the time of the visit.  The patient was started on empiric IV antibiotics for acute cholecystitis.  General surgery was consulted by EDP.  Admitted by [facility], hospitalist service.  ED Course: Temperature 97.5.  BP 117/67, pulse 86, respiration rate 18, O2 saturation 100% on room air.  Review of Systems: Review of systems as noted in the HPI. All other systems reviewed and are negative.   Past Medical History:  Diagnosis Date   CHEST WALL PAIN, ANTERIOR 08/02/2009   Qualifier: Diagnosis of  By: Avram MD, Sheila Little    EPIGASTRIC PAIN, CHRONIC 08/02/2009   Qualifier: Diagnosis of  By: Avram MD, Sheila Little    Nonspecific (abnormal) findings on radiological and other examination of other intrathoracic organs 07/24/2009   Centricity Description: ECHOCARDIOGRAM, ABNORMAL Qualifier: Diagnosis of  By: Cherrie, MD, Sheila Little  Centricity Description: ABNORMAL ECHOCARDIOGRAM Qualifier: Diagnosis of  By: Cherrie, MD, Sheila Little    Precordial pain 07/24/2009   Qualifier: Diagnosis of  By: Cherrie, MD,  Sheila Little    Past Surgical History:  Procedure Laterality Date   WISDOM TOOTH EXTRACTION      Social History:  reports that she has never smoked. She has never used smokeless tobacco. She reports that she does not drink alcohol and does not use drugs.   Allergies  Allergen Reactions   Sumatriptan     Family History  Problem Relation Age of Onset   Migraines Mother       Prior to Admission medications   Medication Sig Start Date End Date Taking? Authorizing Provider  cetirizine (ZYRTEC) 10 MG tablet Take 10 mg by mouth daily as needed for allergies.    [provider]  ferrous sulfate 325 (65 FE) MG tablet Take 325 mg by mouth daily with breakfast.    [provider]  Prenatal Vit-Fe Fumarate-FA (PRENATAL MULTIVITAMIN) TABS tablet Take 1 tablet by mouth daily at 12 noon.    [provider]    Physical Exam: BP 100/64   Pulse 96   Temp 98.4 F (36.9 C) (Oral)   Resp 18   Ht 5' 8 (1.727 m)   Wt 59 kg   LMP 10/19/2023   SpO2 98%   BMI 19.77 kg/m   General: 38 y.o. year-old female well developed well nourished in no acute distress.  Alert and oriented x3. Cardiovascular: Regular rate and rhythm with no rubs or gallops.  No thyromegaly or JVD noted.  No lower extremity edema. 2/4 pulses in all 4 extremities. Respiratory: Clear to auscultation with no wheezes or rales. Good inspiratory effort. Abdomen: Soft nontender nondistended with normal bowel sounds  x4 quadrants. Muskuloskeletal: No cyanosis, clubbing or edema noted bilaterally Neuro: CN II-XII intact, strength, sensation, reflexes Skin: No ulcerative lesions noted or rashes Psychiatry: Judgement and insight appear normal. Mood is appropriate for condition and setting          Labs on Admission:  Basic Metabolic Panel: Recent Labs  Lab 10/19/23 2110  NA 132*  K 3.6  CL 100  CO2 22  GLUCOSE 95  BUN 15  CREATININE 1.04*  CALCIUM 8.3*   Liver Function Tests: Recent Labs   Lab 10/19/23 2110  AST 129*  ALT 76*  ALKPHOS 70  BILITOT 0.8  PROT 6.3*  ALBUMIN 3.5   Recent Labs  Lab 10/19/23 2110  LIPASE 35   No results for input(s): AMMONIA in the last 168 hours. CBC: Recent Labs  Lab 10/19/23 2110  WBC 10.5  HGB 11.8*  HCT 34.3*  MCV 91.5  PLT 181   Cardiac Enzymes: No results for input(s): CKTOTAL, CKMB, CKMBINDEX, TROPONINI in the last 168 hours.  BNP (last 3 results) No results for input(s): BNP in the last 8760 hours.  ProBNP (last 3 results) No results for input(s): PROBNP in the last 8760 hours.  CBG: No results for input(s): GLUCAP in the last 168 hours.  Radiological Exams on Admission: CT ABDOMEN PELVIS W CONTRAST Result Date: 10/19/2023 CLINICAL DATA:  Epigastric pain L side chest pain that radiates to her back that started about 1 hour ago. Also reports nausea. Last meal was around 1800 and reports it was stew that was a little spicy, but nothing abnormal that she has not eaten before. EXAM: CT ABDOMEN AND PELVIS WITH CONTRAST TECHNIQUE: Multidetector CT imaging of the abdomen and pelvis was performed using the standard protocol following bolus administration of intravenous contrast. RADIATION DOSE REDUCTION: This exam was performed according to the departmental dose-optimization program which includes automated exposure control, adjustment of the mA and/or kV according to patient size and/or use of iterative reconstruction technique. CONTRAST:  OMNIPAQUE  IOHEXOL  300 MG/ML  SOLN COMPARISON:  None Available. FINDINGS: Lower chest: No acute abnormality. Hepatobiliary: The liver is enlarged measuring up to 20 cm. No focal liver abnormality. Question cholelithiasis (4:35). Gallbladder wall thickening and pericholecystic fluid. Question portal edema. No biliary dilatation. Pancreas: No focal lesion. Normal pancreatic contour. No surrounding inflammatory changes. No main pancreatic ductal dilatation. Spleen: Normal in size  without focal abnormality. Adrenals/Urinary Tract: No adrenal nodule bilaterally. Bilateral kidneys enhance symmetrically. No hydronephrosis. No hydroureter. The urinary bladder is unremarkable. Stomach/Bowel: Stomach is within normal limits. No evidence of bowel wall thickening or dilatation. Stool throughout the colon. Appendix appears normal. Vascular/Lymphatic: The main portal, splenic, superior mesenteric vein are patent. No abdominal aorta or iliac aneurysm. No abdominal, pelvic, or inguinal lymphadenopathy. Reproductive: Uterus and bilateral adnexa are unremarkable. Tampon within the vagina. Other: No intraperitoneal free fluid. No intraperitoneal free gas. No organized fluid collection. Musculoskeletal: No abdominal wall hernia or abnormality. No suspicious lytic or blastic osseous lesions. No acute displaced fracture. IMPRESSION: 1. Gallbladder wall thickening and pericholecystic fluid. Question cholelithiasis. Recommend right upper quadrant ultrasound for further evaluation. 2. Stool throughout the colon.  Correlate for constipation. 3. Hepatomegaly. Electronically Signed   By: Morgane  Naveau M.D.   On: 10/19/2023 23:07    EKG: I independently viewed the EKG done and my findings are as followed: Sinus rhythm rate of 92.  Nonspecific ST-T changes QTc 422.  Assessment/Plan Present on Admission:  Acute cholecystitis  Principal Problem:   Acute cholecystitis  Presumed acute cholecystitis, POA Seen on contrast-enhanced CT scan Follow HIDA scan, keep n.p.o. 6 hours prior to the study, no opiate-based analgesics, until HIDA scan is completed, if able. IV fluid hydration LR at 100 cc/h x 1 day. General surgery consulted by EDP  Elevated transaminases possibly from suspected acute cholecystitis Hepatomegaly seen on CT scan Avoid hepatotoxic agents.  Mild hyponatremia Serum sodium 132 Repeat CMP   Time: 75 minutes.   DVT prophylaxis: SCDs.  Code Status: Full code.  Family  Communication: None at bedside.  Disposition Plan: Admitted to MedSurg unit.  Consults called: General Surgery consulted by EDP.  Admission status: Inpatient status.   Status is: Inpatient The patient requires at least 2 midnights for further evaluation and treatment of present condition.   Terry LOISE Hurst MD [facility] Pager [phone]  If 7PM-7AM, please contact night-coverage [email or web address] Password [facility]  10/19/2023, 11:52 PM

## 2023-10-19 NOTE — ED Triage Notes (Signed)
 Patient from home for L side chest pain that radiates to her back that started about 1 hour ago. Also reports nausea. Last meal was around 1800 and reports it was stew that was a little spicy, but nothing abnormal that she has not eaten before. Reports she has had this pain before but nothing like this. Upon arrival to ER, patient is alert and oriented, in obvious discomfort

## 2023-10-20 ENCOUNTER — Inpatient Hospital Stay (HOSPITAL_COMMUNITY)

## 2023-10-20 ENCOUNTER — Encounter (HOSPITAL_COMMUNITY): Payer: Self-pay | Admitting: Internal Medicine

## 2023-10-20 DIAGNOSIS — R160 Hepatomegaly, not elsewhere classified: Secondary | ICD-10-CM | POA: Diagnosis not present

## 2023-10-20 DIAGNOSIS — R7401 Elevation of levels of liver transaminase levels: Secondary | ICD-10-CM

## 2023-10-20 DIAGNOSIS — R1013 Epigastric pain: Secondary | ICD-10-CM | POA: Diagnosis not present

## 2023-10-20 LAB — CBC WITH DIFFERENTIAL/PLATELET
Abs Immature Granulocytes: 0.01 K/uL (ref 0.00–0.07)
Basophils Absolute: 0 K/uL (ref 0.0–0.1)
Basophils Relative: 0 %
Eosinophils Absolute: 0.1 K/uL (ref 0.0–0.5)
Eosinophils Relative: 2 %
HCT: 34.4 % — ABNORMAL LOW (ref 36.0–46.0)
Hemoglobin: 11.4 g/dL — ABNORMAL LOW (ref 12.0–15.0)
Immature Granulocytes: 0 %
Lymphocytes Relative: 19 %
Lymphs Abs: 0.9 K/uL (ref 0.7–4.0)
MCH: 31.1 pg (ref 26.0–34.0)
MCHC: 33.1 g/dL (ref 30.0–36.0)
MCV: 93.7 fL (ref 80.0–100.0)
Monocytes Absolute: 0.5 K/uL (ref 0.1–1.0)
Monocytes Relative: 10 %
Neutro Abs: 3.2 K/uL (ref 1.7–7.7)
Neutrophils Relative %: 69 %
Platelets: 166 K/uL (ref 150–400)
RBC: 3.67 MIL/uL — ABNORMAL LOW (ref 3.87–5.11)
RDW: 13 % (ref 11.5–15.5)
WBC: 4.6 K/uL (ref 4.0–10.5)
nRBC: 0 % (ref 0.0–0.2)

## 2023-10-20 LAB — LIPID PANEL
Cholesterol: 112 mg/dL (ref 0–200)
HDL: 54 mg/dL (ref >40)
LDL Cholesterol: 54 mg/dL (ref 0–99)
Total CHOL/HDL Ratio: 2.1 ratio
Triglycerides: 20 mg/dL (ref <150)
VLDL: 4 mg/dL (ref 0–40)

## 2023-10-20 LAB — COMPREHENSIVE METABOLIC PANEL WITH GFR
ALT: 710 U/L — ABNORMAL HIGH (ref 0–44)
AST: 780 U/L — ABNORMAL HIGH (ref 15–41)
Albumin: 3.3 g/dL — ABNORMAL LOW (ref 3.5–5.0)
Alkaline Phosphatase: 78 U/L (ref 38–126)
Anion gap: 7 (ref 5–15)
BUN: 12 mg/dL (ref 6–20)
CO2: 23 mmol/L (ref 22–32)
Calcium: 8.1 mg/dL — ABNORMAL LOW (ref 8.9–10.3)
Chloride: 106 mmol/L (ref 98–111)
Creatinine, Ser: 0.87 mg/dL (ref 0.44–1.00)
GFR, Estimated: >60 mL/min (ref >60)
Glucose, Bld: 100 mg/dL — ABNORMAL HIGH (ref 70–99)
Potassium: 3.7 mmol/L (ref 3.5–5.1)
Sodium: 136 mmol/L (ref 135–145)
Total Bilirubin: 1 mg/dL (ref 0.0–1.2)
Total Protein: 5.9 g/dL — ABNORMAL LOW (ref 6.5–8.1)

## 2023-10-20 LAB — HEPATITIS PANEL, ACUTE
HCV Ab: NONREACTIVE
Hep A IgM: NONREACTIVE
Hep B C IgM: NONREACTIVE
Hepatitis B Surface Ag: NONREACTIVE

## 2023-10-20 LAB — LIPASE, BLOOD: Lipase: 40 U/L (ref 11–51)

## 2023-10-20 LAB — PROTIME-INR
INR: 1.1 (ref 0.8–1.2)
Prothrombin Time: 15.2 s (ref 11.4–15.2)

## 2023-10-20 LAB — PHOSPHORUS: Phosphorus: 4.1 mg/dL (ref 2.5–4.6)

## 2023-10-20 LAB — ACETAMINOPHEN LEVEL: Acetaminophen (Tylenol), Serum: <10 ug/mL — ABNORMAL LOW (ref 10–30)

## 2023-10-20 LAB — MAGNESIUM: Magnesium: 1.9 mg/dL (ref 1.7–2.4)

## 2023-10-20 MED ORDER — MELATONIN 3 MG PO TABS: 6.0000 mg | ORAL_TABLET | Freq: Every evening | ORAL | Status: DC | PRN

## 2023-10-20 MED ORDER — KETOROLAC TROMETHAMINE 15 MG/ML IJ SOLN: 15.0000 mg | Freq: Three times a day (TID) | INTRAMUSCULAR | Status: DC | PRN

## 2023-10-20 MED ORDER — POLYETHYLENE GLYCOL 3350 17 G PO PACK: 17.0000 g | PACK | Freq: Every day | ORAL | Status: DC | PRN

## 2023-10-20 MED ORDER — STERILE WATER FOR INJECTION IJ SOLN
INTRAMUSCULAR | Status: AC
  Filled 2023-10-20: qty 10

## 2023-10-20 MED ORDER — SINCALIDE 5 MCG IJ SOLR
INTRAMUSCULAR | Status: AC
  Filled 2023-10-20: qty 5

## 2023-10-20 MED ORDER — LACTATED RINGERS IV SOLN: INTRAVENOUS | Status: AC

## 2023-10-20 MED ORDER — SINCALIDE 5 MCG IJ SOLR
0.0200 ug/kg | Freq: Once | INTRAMUSCULAR | Status: AC
  Administered 2023-10-20: 1.2 ug via INTRAVENOUS

## 2023-10-20 MED ORDER — TECHNETIUM TC 99M MEBROFENIN IV KIT
5.2000 | PACK | Freq: Once | INTRAVENOUS | Status: AC | PRN
  Administered 2023-10-20: 5.2 via INTRAVENOUS

## 2023-10-20 MED ORDER — PROCHLORPERAZINE EDISYLATE 10 MG/2ML IJ SOLN: 5.0000 mg | Freq: Four times a day (QID) | INTRAMUSCULAR | Status: DC | PRN

## 2023-10-20 NOTE — Consult Note (Addendum)
 I was present with the medical student for this service. I personally verified the history of present illness, performed the physical exam, and made the plan for this encounter. I have verified the medical student's documentation and made modifications where appropriately. I have personally documented in my own words a brief history, physical, and plan below.     Patient with acute onset epigastric pain to the back, says it is like a band around her lower chest upper abdomen. Very large liver on CT scan, some minor periportal edema. HIDA negative for acute cholecystitis and normal EF.  Her LFTs jumped with her AST/ ALT going up to 700+ but normal alk phos and bilirubin. This would be unusual if this is truly gallbladder etiology, and even if she passed a stone you would expect Bili/ Alk phos to go up some in proportion to the AST/ALT. Lipase was normal.   Abd soft, mild tenderness epigastric region, no RUQ tenderness   I have asked GI to weigh in. Hepatitis panel ordered.   No indication right now for cholecystectomy. I discussed all the above with the patient and personally reviewed her imaging and labs and showed her both imaging and labwork. Discussed her having a diet. She is having no nausea or pain now. No nsaid use.   Regular diet GI recommendations appreciated Discussed that this could be her gallbladder but that  right now that is not clear and we want to be sure.  LFTs in the AM No indication for further antibiotics I have changed the problem list from acute cholecystitis to Transaminitis and epigastric pain   Sheila Pander, MD [facility] [address] [place], [place] [address] [phone] (office)    Reason for Consult: Acute Abdominal Pain/Evaluate for Cholecystitis  Referring Physician: Terry Hurst, MD  HPI:  Sheila Little is an 38 y.o. female presenting today for evaluation of sharp epigastric abdominal pain that radiates to her  back. She has had similar episodes in the past 4-6 months where it will be a 6-7/10 pain but something she can push through. These episodes are not improved with rest and just have to go away. Her pain is made worse by bending over when the pain is already starting. The pain is associated with nausea but she has not had vomiting. No unintentional weight loss. Yesterday evening, she had an episode of this epigastric pain that was 10/10 and radiating to her back. She states it feels like she was hit across her midsection with a baseball bat which is what brought her to the ED. Cardiac and pancreatitis workup were negative and abdominal CT showed gallbladder inflammation with question as to the presence of gallstones. She does have a family history of father and uncle both having their gallbladder removed when they were in their thirties despite being otherwise healthy. She was admitted and started on IV antibiotics for presumed cholecystitis. This morning she denies any abdominal pain whatsoever stating the skin tape on her IV tugging hurts more than any belly pain. She denies any significant overnight events.    Past Medical History:  Diagnosis Date   CHEST WALL PAIN, ANTERIOR 08/02/2009   Qualifier: Diagnosis of  By: Avram MD, NOLIA [name]    EPIGASTRIC PAIN, CHRONIC 08/02/2009   Qualifier: Diagnosis of  By: Avram MD, NOLIA [name]    Nonspecific (abnormal) findings on radiological and other examination of other intrathoracic organs 07/24/2009   Centricity Description: ECHOCARDIOGRAM, ABNORMAL Qualifier: Diagnosis of  By:  Bensimhon, MD, CODY Toribio SAUNDERS  Centricity Description: ABNORMAL ECHOCARDIOGRAM Qualifier: Diagnosis of  By: Cherrie, MD, CODY Toribio SAUNDERS    Precordial pain 07/24/2009   Qualifier: Diagnosis of  By: Cherrie, MD, CODY Toribio SAUNDERS     Past Surgical History:  Procedure Laterality Date   WISDOM TOOTH EXTRACTION      Family History  Problem Relation Age of Onset   Migraines Mother      Social History:  reports that she has never smoked. She has never used smokeless tobacco. She reports that she does not drink alcohol and does not use drugs.  Allergies:  Allergies  Allergen Reactions   Sumatriptan     Medications: I have reviewed the patient's current medications. Current Facility-Administered Medications  Medication Dose Route Frequency Provider Last Rate Last Admin   ketorolac  (TORADOL ) 15 MG/ML injection 15 mg  15 mg Intravenous Q8H PRN Shona Terry SAILOR, DO       lactated ringers  infusion   Intravenous Continuous Shona Terry SAILOR, DO 100 mL/hr at 10/20/23 0434 New Bag at 10/20/23 0434   melatonin tablet 6 mg  6 mg Oral QHS PRN Shona Terry SAILOR, DO       polyethylene glycol (MIRALAX  / GLYCOLAX ) packet 17 g  17 g Oral Daily PRN Hall, Carole N, DO       prochlorperazine  (COMPAZINE ) injection 5 mg  5 mg Intravenous Q6H PRN Shona Terry SAILOR, DO        Results for orders placed or performed during the hospital encounter of 10/19/23 (from the past 48 hours)  Basic metabolic panel     Status: Abnormal   Collection Time: 10/19/23  9:10 PM  Result Value Ref Range   Sodium 132 (L) 135 - 145 mmol/L   Potassium 3.6 3.5 - 5.1 mmol/L   Chloride 100 98 - 111 mmol/L   CO2 22 22 - 32 mmol/L   Glucose, Bld 95 70 - 99 mg/dL    Comment: Glucose reference range applies only to samples taken after fasting for at least 8 hours.   BUN 15 6 - 20 mg/dL   Creatinine, Ser 8.95 (H) 0.44 - 1.00 mg/dL   Calcium 8.3 (L) 8.9 - 10.3 mg/dL   GFR, Estimated >39 >39 mL/min    Comment: (NOTE) Calculated using the CKD-EPI Creatinine Equation (2021)    Anion gap 10 5 - 15    Comment: Performed at [facility], [address], [place], [place] [address]  CBC     Status: Abnormal   Collection Time: 10/19/23  9:10 PM  Result Value Ref Range   WBC 10.5 4.0 - 10.5 K/uL   RBC 3.75 (L) 3.87 - 5.11 MIL/uL   Hemoglobin 11.8 (L) 12.0 - 15.0 g/dL   HCT 65.6 (L) 63.9 - 53.9 %   MCV 91.5 80.0 - 100.0 fL    MCH 31.5 26.0 - 34.0 pg   MCHC 34.4 30.0 - 36.0 g/dL   RDW 86.8 88.4 - 84.4 %   Platelets 181 150 - 400 K/uL   nRBC 0.0 0.0 - 0.2 %    Comment: Performed at [facility], [address] [place], [place] [address]  Troponin I (High Sensitivity)     Status: None   Collection Time: 10/19/23  9:10 PM  Result Value Ref Range   Troponin I (High Sensitivity) <2 <18 ng/L    Comment: (NOTE) Elevated high sensitivity troponin I (hsTnI) values and significant  changes across serial measurements may suggest ACS but  many other  chronic and acute conditions are known to elevate hsTnI results.  Refer to the Links section for chest pain algorithms and additional  guidance. Performed at [facility], [address] [place], [place] [address]   hCG, quantitative, pregnancy     Status: None   Collection Time: 10/19/23  9:10 PM  Result Value Ref Range   hCG, Beta Chain, Quant, S <1 <5 mIU/mL    Comment:          GEST. AGE      CONC.  (mIU/mL)   <=1 WEEK        5 - 50     2 WEEKS       50 - 500     3 WEEKS       100 - 10,000     4 WEEKS     1,000 - 30,000     5 WEEKS     3,500 - 115,000   6-8 WEEKS     12,000 - 270,000    12 WEEKS     15,000 - 220,000        FEMALE AND NON-PREGNANT FEMALE:     LESS THAN 5 mIU/mL Performed at [facility], [address], [place], [place] [address]   Lipase, blood     Status: None   Collection Time: 10/19/23  9:10 PM  Result Value Ref Range   Lipase 35 11 - 51 U/L    Comment: Performed at [facility], [address], [place], [place] [address]  Hepatic function panel     Status: Abnormal   Collection Time: 10/19/23  9:10 PM  Result Value Ref Range   Total Protein 6.3 (L) 6.5 - 8.1 g/dL   Albumin 3.5 3.5 - 5.0 g/dL   AST 870 (H) 15 - 41 U/L   ALT 76 (H) 0 - 44 U/L   Alkaline Phosphatase 70 38 - 126 U/L   Total Bilirubin 0.8 0.0 - 1.2 mg/dL   Bilirubin, Direct 0.2 0.0 - 0.2 mg/dL   Indirect Bilirubin 0.6 0.3 - 0.9 mg/dL    Comment: Performed at [facility], [address] [place], [place] [address]  Urinalysis, Routine w reflex microscopic -Urine, Clean Catch     Status: Abnormal   Collection Time: 10/19/23  9:58 PM  Result Value Ref Range   Color, Urine STRAW (A) YELLOW   APPearance CLEAR CLEAR   Specific Gravity, Urine 1.005 1.005 - 1.030   pH 8.0 5.0 - 8.0   Glucose, UA NEGATIVE NEGATIVE mg/dL   Hgb urine dipstick NEGATIVE NEGATIVE   Bilirubin Urine NEGATIVE NEGATIVE   Ketones, ur NEGATIVE NEGATIVE mg/dL   Protein, ur NEGATIVE NEGATIVE mg/dL   Nitrite NEGATIVE NEGATIVE   Leukocytes,Ua NEGATIVE NEGATIVE    Comment: Performed at [facility], [address] [place], [place] [address]  POC urine preg, ED     Status: None   Collection Time: 10/19/23 10:02 PM  Result Value Ref Range   Preg Test, Ur NEGATIVE NEGATIVE    Comment:        THE SENSITIVITY OF THIS METHODOLOGY IS >20 mIU/mL.   Lipid panel     Status: None   Collection Time: 10/20/23  3:48 AM  Result Value Ref Range   Cholesterol 112 0 - 200 mg/dL   Triglycerides 20 <849 mg/dL   HDL 54 >59 mg/dL   Total CHOL/HDL Ratio 2.1 RATIO   VLDL 4 0 - 40 mg/dL   LDL  Cholesterol 54 0 - 99 mg/dL    Comment:        Total Cholesterol/HDL:CHD Risk Coronary Heart Disease Risk Table                     Men   Women  1/2 Average Risk   3.4   3.3  Average Risk       5.0   4.4  2 X Average Risk   9.6   7.1  3 X Average Risk  23.4   11.0        Use the calculated Patient Ratio above and the CHD Risk Table to determine the patient's CHD Risk.        ATP III CLASSIFICATION (LDL):  <100     mg/dL   Optimal  899-870  mg/dL   Near or Above                    Optimal  130-159  mg/dL   Borderline  839-810  mg/dL   High  >809     mg/dL   Very High Performed at [facility], [address], [place], [place] [address]   CBC with Differential/Platelet     Status: Abnormal   Collection Time: 10/20/23  3:48 AM  Result Value Ref Range   WBC 4.6 4.0 - 10.5 K/uL   RBC 3.67 (L) 3.87 - 5.11  MIL/uL   Hemoglobin 11.4 (L) 12.0 - 15.0 g/dL   HCT 65.5 (L) 63.9 - 53.9 %   MCV 93.7 80.0 - 100.0 fL   MCH 31.1 26.0 - 34.0 pg   MCHC 33.1 30.0 - 36.0 g/dL   RDW 86.9 88.4 - 84.4 %   Platelets 166 150 - 400 K/uL   nRBC 0.0 0.0 - 0.2 %   Neutrophils Relative % 69 %   Neutro Abs 3.2 1.7 - 7.7 K/uL   Lymphocytes Relative 19 %   Lymphs Abs 0.9 0.7 - 4.0 K/uL   Monocytes Relative 10 %   Monocytes Absolute 0.5 0.1 - 1.0 K/uL   Eosinophils Relative 2 %   Eosinophils Absolute 0.1 0.0 - 0.5 K/uL   Basophils Relative 0 %   Basophils Absolute 0.0 0.0 - 0.1 K/uL   Immature Granulocytes 0 %   Abs Immature Granulocytes 0.01 0.00 - 0.07 K/uL    Comment: Performed at [facility], [address] [place], [place] [address]  Comprehensive metabolic panel     Status: Abnormal   Collection Time: 10/20/23  3:48 AM  Result Value Ref Range   Sodium 136 135 - 145 mmol/L   Potassium 3.7 3.5 - 5.1 mmol/L   Chloride 106 98 - 111 mmol/L   CO2 23 22 - 32 mmol/L   Glucose, Bld 100 (H) 70 - 99 mg/dL    Comment: Glucose reference range applies only to samples taken after fasting for at least 8 hours.   BUN 12 6 - 20 mg/dL   Creatinine, Ser 9.12 0.44 - 1.00 mg/dL   Calcium 8.1 (L) 8.9 - 10.3 mg/dL   Total Protein 5.9 (L) 6.5 - 8.1 g/dL   Albumin 3.3 (L) 3.5 - 5.0 g/dL   AST 219 (H) 15 - 41 U/L   ALT 710 (H) 0 - 44 U/L   Alkaline Phosphatase 78 38 - 126 U/L   Total Bilirubin 1.0 0.0 - 1.2 mg/dL   GFR, Estimated >39 >39 mL/min    Comment: (NOTE) Calculated using the CKD-EPI Creatinine  Equation (2021)    Anion gap 7 5 - 15    Comment: Performed at [facility], [address] [place], [place] [address]  Magnesium     Status: None   Collection Time: 10/20/23  3:48 AM  Result Value Ref Range   Magnesium 1.9 1.7 - 2.4 mg/dL    Comment: Performed at [facility], [address] [place], [place] [address]  Phosphorus     Status: None   Collection Time: 10/20/23  3:48 AM  Result Value Ref Range    Phosphorus 4.1 2.5 - 4.6 mg/dL    Comment: Performed at [facility], [address] [place], [place] [address]    CT ABDOMEN PELVIS W CONTRAST Result Date: 10/19/2023 CLINICAL DATA:  Epigastric pain L side chest pain that radiates to her back that started about 1 hour ago. Also reports nausea. Last meal was around 1800 and reports it was stew that was a little spicy, but nothing abnormal that she has not eaten before. EXAM: CT ABDOMEN AND PELVIS WITH CONTRAST TECHNIQUE: Multidetector CT imaging of the abdomen and pelvis was performed using the standard protocol following bolus administration of intravenous contrast. RADIATION DOSE REDUCTION: This exam was performed according to the departmental dose-optimization program which includes automated exposure control, adjustment of the mA and/or kV according to patient size and/or use of iterative reconstruction technique. CONTRAST:  OMNIPAQUE  IOHEXOL  300 MG/ML  SOLN COMPARISON:  None Available. FINDINGS: Lower chest: No acute abnormality. Hepatobiliary: The liver is enlarged measuring up to 20 cm. No focal liver abnormality. Question cholelithiasis (4:35). Gallbladder wall thickening and pericholecystic fluid. Question portal edema. No biliary dilatation. Pancreas: No focal lesion. Normal pancreatic contour. No surrounding inflammatory changes. No main pancreatic ductal dilatation. Spleen: Normal in size without focal abnormality. Adrenals/Urinary Tract: No adrenal nodule bilaterally. Bilateral kidneys enhance symmetrically. No hydronephrosis. No hydroureter. The urinary bladder is unremarkable. Stomach/Bowel: Stomach is within normal limits. No evidence of bowel wall thickening or dilatation. Stool throughout the colon. Appendix appears normal. Vascular/Lymphatic: The main portal, splenic, superior mesenteric vein are patent. No abdominal aorta or iliac aneurysm. No abdominal, pelvic, or inguinal lymphadenopathy. Reproductive: Uterus and bilateral adnexa are  unremarkable. Tampon within the vagina. Other: No intraperitoneal free fluid. No intraperitoneal free gas. No organized fluid collection. Musculoskeletal: No abdominal wall hernia or abnormality. No suspicious lytic or blastic osseous lesions. No acute displaced fracture. IMPRESSION: 1. Gallbladder wall thickening and pericholecystic fluid. Question cholelithiasis. Recommend right upper quadrant ultrasound for further evaluation. 2. Stool throughout the colon.  Correlate for constipation. 3. Hepatomegaly. Electronically Signed   By: Morgane  Naveau M.D.   On: 10/19/2023 23:07    ROS:  Pertinent items are noted in HPI.  Blood pressure (!) 102/58, pulse (!) 51, temperature 98.5 F (36.9 C), temperature source Oral, resp. rate 16, height 5' 8 (1.727 m), weight 59 kg, last menstrual period 10/19/2023, SpO2 97%, unknown if currently breastfeeding. Physical Exam:  Physical Exam Constitutional:      General: She is not in acute distress.    Appearance: Normal appearance. She is normal weight.  HENT:     Head: Normocephalic and atraumatic.     Right Ear: External ear normal.     Left Ear: External ear normal.     Mouth/Throat:     Mouth: Mucous membranes are moist.     Pharynx: Oropharynx is clear. No oropharyngeal exudate.  Eyes:     General: No scleral icterus.    Extraocular Movements: Extraocular movements  intact.     Pupils: Pupils are equal, round, and reactive to light.  Cardiovascular:     Rate and Rhythm: Normal rate and regular rhythm.     Pulses: Normal pulses.     Heart sounds: No murmur heard.    No friction rub. No gallop.  Pulmonary:     Effort: Pulmonary effort is normal. No respiratory distress.     Breath sounds: Normal breath sounds.  Abdominal:     General: Abdomen is flat. There is no distension.     Palpations: Abdomen is soft.     Tenderness: There is no abdominal tenderness. There is no guarding or rebound.  Skin:    General: Skin is warm and dry.  Neurological:      General: No focal deficit present.     Mental Status: She is alert.     Assessment/Plan: Impression: Acute Cholecystitis/Possible Hepatitis  Assessment: Patients Imaging so far has been suggestive of cholecystitis and she does have a family history of this. She does not necessarily fit the classical clinical picture and her description of pain is also consistent with pancreatitis. With her liver enlarged and elevated AST and ALT without Alk phos or bilirubin elevation could also be hepatitis-related.  Plan: HIDA Scan scheduled for today to guide management. If determined to be cholecystitis, can proceed with robotic-assisted laparoscopic cholecystectomy. If negative, consider GI referral for further workup.   Sheila Little 10/20/2023, 9:05 AM

## 2023-10-20 NOTE — Progress Notes (Signed)
  Progress Note   Patient: Sheila Little FMW:[id number] DOB: [birth year] DOA: 10/19/2023     1 DOS: the patient was seen and examined on 10/20/2023   Brief hospital admission narrative : As per H&P written by Dr. Shona on 10/19/2023 Sheila Little is a 38 y.o. female with no significant medical history who presents to the ER due to severe right upper quadrant abdominal pain.  Symptoms started about 2 months ago and have been intermittent.  Associated with chills for the past 24 hours.  No vomiting or diarrhea.   In the ER, tachypneic.  CT abdomen and pelvis with contrast revealed gallbladder wall thickening and pericholecystic fluid.  Question cholelithiasis.  Recommend right upper quadrant ultrasound for further evaluation.  Stool throughout the colon correlate for constipation.  Hepatomegaly.  AST and ALT elevated.   Symptoms are improved at the time of the visit.  The patient was started on empiric IV antibiotics for acute cholecystitis.  General surgery was consulted by EDP.  Admitted by [facility], hospitalist service.   ED Course: Temperature 97.5.  BP 117/67, pulse 86, respiration rate 18, O2 saturation 100% on room air.  Assessment and plan 1-transaminitis/epigastric abdominal pain - Initially with concerns for cholelithiasis with possible cholecystitis - HIDA scan demonstrating normal-appearing gallbladder - No fever, no elevated WBCs, and normal bilirubin and alk phos levels. - GI service has been consulted for further evaluation and management of transaminitis process - Continue PPI - Maintain and adequate hydration and allow for regular diet while avoiding hepatotoxic agents. -Right upper quadrant ultrasound has been requested.  2-mild hyponatremia - In the setting of dehydration and decreased oral intake - Continue to maintain adequate hydration and follow electrolytes trend.  3-presumed GERD - As mentioned above continue PPI - Will follow GI service recommendations for  possible endoscopic evaluation once LFTs workup completed.   Subjective:  Complaining of epigastric discomfort; no nausea vomiting.  Patient is afebrile.  Physical Exam: Vitals:   10/20/23 0040 10/20/23 0041 10/20/23 0601 10/20/23 1500  BP:  117/67 (!) 102/58 104/69  Pulse:  86 (!) 51 62  Resp:  18 16   Temp:  (!) 97.5 F (36.4 C) 98.5 F (36.9 C) 98 F (36.7 C)  TempSrc:  Oral Oral Oral  SpO2:  100% 97% 99%  Weight:      Height: 5' 8 (1.727 m)      General exam: Alert, awake, oriented x 3 Respiratory system: Clear to auscultation. Respiratory effort normal. Cardiovascular system:RRR. No murmurs, rubs, gallops. Gastrointestinal system: Abdomen is nondistended, soft and without guarding; mild discomfort to palpation in midepigastric area. Central nervous system: No focal neurological deficits. Extremities: No cyanosis or clubbing. Skin: No petechiae. Psychiatry: Judgement and insight appear normal. Mood & affect appropriate.    Data Reviewed: CBC: WBCs 4.6, hemoglobin 11.4 and platelet count 1 66K Lipid panel: Total cholesterol 112, HDL 54 and LDL 54 Comprehensive metabolic panel: Sodium 136, potassium 3.7, chloride 106, bicarb 23, BUN 12, creatinine 0.87, AST 780, ALT 17, alkaline phosphatase 78, total bilirubin 1.0 and GFR >60  Family Communication: No family at bedside.  Disposition: Status is: Inpatient Remains inpatient appropriate because: Continue to follow workup and treatment for transaminitis.  Anticipating discharge back home once medically stable.  Time spent: 35 minutes  Author: Eric Nunnery, MD 10/20/2023 4:11 PM  For on call review [email or web address].

## 2023-10-20 NOTE — Consult Note (Signed)
 Gastroenterology Consult   Referring Provider: No ref. provider found Primary Care Physician:  Sheila Jerel MATSU, MD Primary Gastroenterologist: not established (previously Dr. Avram)  Patient ID: Sheila Little; [id number]; [birth year]   Admit date: 10/19/2023  LOS: 1 day   Date of Consultation: 10/20/2023  Reason for Consultation:  Upper abdominal pain, elevated LFTs   History of Present Illness   Sheila Little is a 38 y.o. year old female with no signficant medical history who presented to the ED with C/o epigastric/chest pain radiating to her back as well as nausea. Patient underwent CT A/P with showed concerns for cholecystitis, general surgery consulted. LFTs rose significantly on repeat, GI consulted for further evaluation  Pertinent data: CT A/P with contrast  Gallbladder wall thickening and pericholecystic fluid. Question cholelithiasis. Recommend right upper quadrant ultrasound for further evaluation. 2. Stool throughout the colon.  Correlate for constipation. 3. Hepatomegaly.  Hgb 11.8  Lipase 35 AST 129, ALT 76, Alk phos 70, T Bili 0.8  Lipid panel WNL,   CMP this morning with AST 780, ALT 710, plt count 166k   HIDA scan with normal GB but periportal edema, concerning for hepatitis   Consult: Patient states severe, sudden onset of epigastric pain that had her doubled over in pain. She notes she had a slight off feeling during cooking but then really noticed the pain while she was bending over to give her son a bath. Pain worse when bending over. She states pain lasted about 1 hour. She notes pain seemed to ease some on her way to the ED. She notes two previous episodes of pain similar to this but not near as severe, last was about 1 month ago and then a few months prior to that. She notes that pain seems to radiate through to her back. She did take 2 tylenol  last night and pain seemed to ease off. Denies frequent tylenol  use. Per chart review, appears she had  some similar symptoms present in 2011 though she notes not nearly as severe as this, when she underwent EGD which showed gastritis. She does note that her husband has been sick recently with a headache and fever and was seen at [facility], diagnosed with a viral infection (flu/covid negative). No real GERD symptoms, does not take NSAIDs.   She denies any history of liver disease or issues she is aware of. Last LFTs in chart from 2023 were WNL. She does do organic CBD, occasionally does organic microdosing of THC, also uses functional mushrooms, does these PRN and juice plus vitamins. No recent antibiotics or other prescription meds. Rarely eats out, no travel outside of the country recently.   No etoh, or tobacco use, no history of IVDU   EGD in 2011: gastritis, no H pylori   Past Medical History:  Diagnosis Date   CHEST WALL PAIN, ANTERIOR 08/02/2009   Qualifier: Diagnosis of  By: Avram MD, NOLIA [name]    EPIGASTRIC PAIN, CHRONIC 08/02/2009   Qualifier: Diagnosis of  By: Avram MD, NOLIA [name]    Nonspecific (abnormal) findings on radiological and other examination of other intrathoracic organs 07/24/2009   Centricity Description: ECHOCARDIOGRAM, ABNORMAL Qualifier: Diagnosis of  By: Cherrie, MD, CODY Sheila SAUNDERS  Centricity Description: ABNORMAL ECHOCARDIOGRAM Qualifier: Diagnosis of  By: Cherrie, MD, CODY Sheila SAUNDERS    Precordial pain 07/24/2009   Qualifier: Diagnosis of  By: Cherrie, MD, CODY Sheila SAUNDERS     Past Surgical History:  Procedure Laterality Date   WISDOM  TOOTH EXTRACTION      Prior to Admission medications   Medication Sig Start Date End Date Taking? Authorizing Provider  ibuprofen  (ADVIL ) 600 MG tablet Take 600 mg by mouth every 6 (six) hours as needed for moderate pain (pain score 4-6).   Yes [provider]    Current Facility-Administered Medications  Medication Dose Route Frequency Provider Last Rate Last Admin   ketorolac  (TORADOL ) 15 MG/ML injection 15 mg  15  mg Intravenous Q8H PRN Shona Terry SAILOR, DO       lactated ringers  infusion   Intravenous Continuous Shona Terry SAILOR, DO 100 mL/hr at 10/20/23 0434 New Bag at 10/20/23 0434   melatonin tablet 6 mg  6 mg Oral QHS PRN Shona Terry SAILOR, DO       polyethylene glycol (MIRALAX  / GLYCOLAX ) packet 17 g  17 g Oral Daily PRN Shona Terry SAILOR, DO       prochlorperazine  (COMPAZINE ) injection 5 mg  5 mg Intravenous Q6H PRN Shona Terry N, DO        Allergies as of 10/19/2023 - Review Complete 10/19/2023  Allergen Reaction Noted   Sumatriptan  11/24/2019    Family History  Problem Relation Age of Onset   Migraines Mother     Social History   Socioeconomic History   Marital status: Married    Spouse name: Not on file   Number of children: Not on file   Years of education: Not on file   Highest education level: Not on file  Occupational History   Not on file  Tobacco Use   Smoking status: Never   Smokeless tobacco: Never  Vaping Use   Vaping status: Never Used  Substance and Sexual Activity   Alcohol use: No   Drug use: No   Sexual activity: Not Currently  Other Topics Concern   Not on file  Social History Narrative   Not on file   Social Drivers of Health   Financial Resource Strain: Not on file  Food Insecurity: No Food Insecurity (10/20/2023)   Hunger Vital Sign    Worried About Running Out of Food in the Last Year: Never true    Ran Out of Food in the Last Year: Never true  Transportation Needs: No Transportation Needs (10/20/2023)   PRAPARE - Administrator, Civil Service (Medical): No    Lack of Transportation (Non-Medical): No  Physical Activity: Not on file  Stress: Not on file  Social Connections: Not on file  Intimate Partner Violence: Not At Risk (10/20/2023)   Humiliation, Afraid, Rape, and Kick questionnaire    Fear of Current or Ex-Partner: No    Emotionally Abused: No    Physically Abused: No    Sexually Abused: No     Review of Systems   Gen: Denies  any fever, chills, loss of appetite, change in weight or weight loss CV: Denies chest pain, heart palpitations, syncope, edema  Resp: Denies shortness of breath with rest, cough, wheezing, coughing up blood, and pleurisy. GI: denies melena, hematochezia, nausea, vomiting, diarrhea, constipation, dysphagia, odyonophagia, early satiety or weight loss. +epigastric pain GU : Denies urinary burning, blood in urine, urinary frequency, and urinary incontinence. MS: Denies joint pain, limitation of movement, swelling, cramps, and atrophy.  Derm: Denies rash, itching, dry skin, hives. Psych: Denies depression, anxiety, memory loss, hallucinations, and confusion. Heme: Denies bruising or bleeding Neuro:  Denies any headaches, dizziness, paresthesias, shaking  Physical Exam   Vital Signs in last 24  hours: Temp:  [97.5 F (36.4 C)-98.5 F (36.9 C)] 98.5 F (36.9 C) (07/21 0601) Pulse Rate:  [51-96] 51 (07/21 0601) Resp:  [16-24] 16 (07/21 0601) BP: (100-117)/(58-95) 102/58 (07/21 0601) SpO2:  [97 %-100 %] 97 % (07/21 0601) Weight:  [59 kg] 59 kg (07/20 2024) Last BM Date : 10/19/23  General:   Alert,  Well-developed, well-nourished, pleasant and cooperative in NAD Head:  Normocephalic and atraumatic. Eyes:  Sclera clear, no icterus.   Conjunctiva pink. Ears:  Normal auditory acuity. Mouth:  No deformity or lesions, dentition normal. Neck:  Supple; no masses Lungs:  Clear throughout to auscultation.   No wheezes, crackles, or rhonchi. No acute distress. Heart:  Regular rate and rhythm; no murmurs, clicks, rubs,  or gallops. Abdomen:  Soft, nontender and nondistended. No masses, palpable liver border. Normal bowel sounds, without guarding, and without rebound.   Msk:  Symmetrical without gross deformities. Normal posture. Extremities:  Without clubbing or edema. Neurologic:  Alert and  oriented x4. Skin:  Intact without significant lesions or rashes. Psych:  Alert and cooperative. Normal mood  and affect.  Intake/Output from previous day: 07/20 0701 - 07/21 0700 In: 92.8 [I.V.:42.8; IV Piggyback:50] Out: -  Intake/Output this shift: No intake/output data recorded.   Labs/Studies   Recent Labs Recent Labs    10/19/23 2110 10/20/23 0348  WBC 10.5 4.6  HGB 11.8* 11.4*  HCT 34.3* 34.4*  PLT 181 166   BMET Recent Labs    10/19/23 2110 10/20/23 0348  NA 132* 136  K 3.6 3.7  CL 100 106  CO2 22 23  GLUCOSE 95 100*  BUN 15 12  CREATININE 1.04* 0.87  CALCIUM 8.3* 8.1*   LFT Recent Labs    10/19/23 2110 10/20/23 0348  PROT 6.3* 5.9*  ALBUMIN 3.5 3.3*  AST 129* 780*  ALT 76* 710*  ALKPHOS 70 78  BILITOT 0.8 1.0  BILIDIR 0.2  --   IBILI 0.6  --    PT/INR No results for input(s): LABPROT, INR in the last 72 hours. Hepatitis Panel No results for input(s): HEPBSAG, HCVAB, HEPAIGM, HEPBIGM in the last 72 hours. C-Diff No results for input(s): CDIFFTOX in the last 72 hours.  Radiology/Studies CT ABDOMEN PELVIS W CONTRAST Result Date: 10/19/2023 CLINICAL DATA:  Epigastric pain L side chest pain that radiates to her back that started about 1 hour ago. Also reports nausea. Last meal was around 1800 and reports it was stew that was a little spicy, but nothing abnormal that she has not eaten before. EXAM: CT ABDOMEN AND PELVIS WITH CONTRAST TECHNIQUE: Multidetector CT imaging of the abdomen and pelvis was performed using the standard protocol following bolus administration of intravenous contrast. RADIATION DOSE REDUCTION: This exam was performed according to the departmental dose-optimization program which includes automated exposure control, adjustment of the mA and/or kV according to patient size and/or use of iterative reconstruction technique. CONTRAST:  100mL OMNIPAQUE  IOHEXOL  300 MG/ML  SOLN COMPARISON:  None Available. FINDINGS: Lower chest: No acute abnormality. Hepatobiliary: The liver is enlarged measuring up to 20 cm. No focal liver  abnormality. Question cholelithiasis (4:35). Gallbladder wall thickening and pericholecystic fluid. Question portal edema. No biliary dilatation. Pancreas: No focal lesion. Normal pancreatic contour. No surrounding inflammatory changes. No main pancreatic ductal dilatation. Spleen: Normal in size without focal abnormality. Adrenals/Urinary Tract: No adrenal nodule bilaterally. Bilateral kidneys enhance symmetrically. No hydronephrosis. No hydroureter. The urinary bladder is unremarkable. Stomach/Bowel: Stomach is within normal limits. No evidence of bowel  wall thickening or dilatation. Stool throughout the colon. Appendix appears normal. Vascular/Lymphatic: The main portal, splenic, superior mesenteric vein are patent. No abdominal aorta or iliac aneurysm. No abdominal, pelvic, or inguinal lymphadenopathy. Reproductive: Uterus and bilateral adnexa are unremarkable. Tampon within the vagina. Other: No intraperitoneal free fluid. No intraperitoneal free gas. No organized fluid collection. Musculoskeletal: No abdominal wall hernia or abnormality. No suspicious lytic or blastic osseous lesions. No acute displaced fracture. IMPRESSION: 1. Gallbladder wall thickening and pericholecystic fluid. Question cholelithiasis. Recommend right upper quadrant ultrasound for further evaluation. 2. Stool throughout the colon.  Correlate for constipation. 3. Hepatomegaly. Electronically Signed   By: Morgane  Naveau M.D.   On: 10/19/2023 23:07   Assessment   Sheila Little is a 38 y.o. year old female with no significant medical history who presented to the ED with chest/epigastric pain radiating to the back, subsequently with significant rise in her aminotransferases, hepatomegaly on imaging.  GI consulted for further evaluation  Elevated LFTs/hepatomegaly: -initial labs with AST 129, ALT 76, Alk phos 70, T Bili 0.8  -CMP this morning with AST 780, ALT 710, plt count 166k  -hepatomegaly on CT, periportal edema on HIDA   -HIDA scan with normal GB function -endorses use of CBD, functional mushrooms, no ilicit drugs/new Rx, no alcohol -acute hepatitis panel and US  liver doppler pending -reassuringly INR and plt count normal -acetaminophen  level WNL  Etiology of elevated LFTs unclear at this time. Could be dealing with an acute viral hepatitis, could be substance induced liver injury from supplements she is taking. Also cannot rule out autoimmune etiologies, Budd-chiari malformation not excluded.   Epigastric pain: -acute onset last night, has had previous episodes though not as severe -CT as outlined above, concerning for cholecystitis, HIDA scan normal, CT findings likely from enlarged/inflamed liver -similar presentation in 2011 though appears pain much less severe, EGD at that time with gastritis -no real GERD symptoms -suspect hepatomegaly/inflamed liver is playing a role here -discussed potential for EGD with the patient, may need to consider this once workup for hepatomegaly and elevated LFTs is complete   Plan / Recommendations   -Acute Hepatitis panel -trend LFTs, INR, platelet count daily  -US  liver doppler  -autoimmune serologies (ANA, ASMA, AMA)  -consider CMV, EBV, HSV, HEV testing if the above is unrevealing  -avoid hepatotoxic meds  -PPI daily -may consider EGD for further evaluation of epigastric pain once workup for elevated LFTs is complete   10/20/2023, 12:09 PM  Sloka Volante L. Abdurahman Rugg, MSN, APRN, AGNP-C Adult-Gerontology Nurse Practitioner [facility] at [facility]

## 2023-10-20 NOTE — Plan of Care (Signed)
  Problem: Education: Goal: Knowledge of General Education information will improve Description: Including pain rating scale, medication(s)/side effects and non-pharmacologic comfort measures Outcome: Progressing   Problem: Health Behavior/Discharge Planning: Goal: Ability to manage health-related needs will improve Outcome: Progressing   Problem: Clinical Measurements: Goal: Respiratory complications will improve Outcome: Progressing Goal: Cardiovascular complication will be avoided Outcome: Progressing   Problem: Activity: Goal: Risk for activity intolerance will decrease Outcome: Progressing   Problem: Coping: Goal: Level of anxiety will decrease Outcome: Progressing   

## 2023-10-20 NOTE — Progress Notes (Signed)
   10/20/23 1117  TOC Brief Assessment  Insurance and Status Reviewed  Patient has primary care physician Yes  Home environment has been reviewed Home with spouse  Prior level of function: Independent  Prior/Current Home Services No current home services  Social Drivers of Health Review SDOH reviewed no interventions necessary  Readmission risk has been reviewed Yes  Transition of care needs no transition of care needs at this time   Transition of Care Department [facility]) has reviewed patient and no TOC needs have been identified at this time. We will continue to monitor patient advancement through interdisciplinary progression rounds. If new patient transition needs arise, please place a TOC consult.

## 2023-10-20 NOTE — Plan of Care (Signed)

## 2023-10-20 NOTE — Plan of Care (Signed)
  Problem: Education: °Goal: Knowledge of General Education information will improve °Description: Including pain rating scale, medication(s)/side effects and non-pharmacologic comfort measures °Outcome: Progressing °  °Problem: Clinical Measurements: °Goal: Will remain free from infection °Outcome: Progressing °Goal: Diagnostic test results will improve °Outcome: Progressing °  °Problem: Nutrition: °Goal: Adequate nutrition will be maintained °Outcome: Progressing °  °

## 2023-10-21 ENCOUNTER — Telehealth: Payer: Self-pay | Admitting: *Deleted

## 2023-10-21 ENCOUNTER — Other Ambulatory Visit: Payer: Self-pay | Admitting: *Deleted

## 2023-10-21 ENCOUNTER — Telehealth: Payer: Self-pay | Admitting: Gastroenterology

## 2023-10-21 DIAGNOSIS — K810 Acute cholecystitis: Principal | ICD-10-CM | POA: Diagnosis present

## 2023-10-21 DIAGNOSIS — R7401 Elevation of levels of liver transaminase levels: Secondary | ICD-10-CM | POA: Diagnosis not present

## 2023-10-21 DIAGNOSIS — R7989 Other specified abnormal findings of blood chemistry: Secondary | ICD-10-CM

## 2023-10-21 DIAGNOSIS — R160 Hepatomegaly, not elsewhere classified: Secondary | ICD-10-CM | POA: Diagnosis not present

## 2023-10-21 DIAGNOSIS — R1013 Epigastric pain: Secondary | ICD-10-CM

## 2023-10-21 DIAGNOSIS — K8050 Calculus of bile duct without cholangitis or cholecystitis without obstruction: Secondary | ICD-10-CM | POA: Diagnosis not present

## 2023-10-21 LAB — COMPREHENSIVE METABOLIC PANEL WITH GFR
ALT: 390 U/L — ABNORMAL HIGH (ref 0–44)
AST: 181 U/L — ABNORMAL HIGH (ref 15–41)
Albumin: 3 g/dL — ABNORMAL LOW (ref 3.5–5.0)
Alkaline Phosphatase: 65 U/L (ref 38–126)
Anion gap: 10 (ref 5–15)
BUN: 10 mg/dL (ref 6–20)
CO2: 24 mmol/L (ref 22–32)
Calcium: 7.9 mg/dL — ABNORMAL LOW (ref 8.9–10.3)
Chloride: 106 mmol/L (ref 98–111)
Creatinine, Ser: 0.74 mg/dL (ref 0.44–1.00)
GFR, Estimated: >60 mL/min (ref >60)
Glucose, Bld: 87 mg/dL (ref 70–99)
Potassium: 3.7 mmol/L (ref 3.5–5.1)
Sodium: 140 mmol/L (ref 135–145)
Total Bilirubin: 0.4 mg/dL (ref 0.0–1.2)
Total Protein: 5.5 g/dL — ABNORMAL LOW (ref 6.5–8.1)

## 2023-10-21 LAB — CBC
HCT: 33.7 % — ABNORMAL LOW (ref 36.0–46.0)
Hemoglobin: 11.2 g/dL — ABNORMAL LOW (ref 12.0–15.0)
MCH: 31.3 pg (ref 26.0–34.0)
MCHC: 33.2 g/dL (ref 30.0–36.0)
MCV: 94.1 fL (ref 80.0–100.0)
Platelets: 161 K/uL (ref 150–400)
RBC: 3.58 MIL/uL — ABNORMAL LOW (ref 3.87–5.11)
RDW: 13 % (ref 11.5–15.5)
WBC: 4 K/uL (ref 4.0–10.5)
nRBC: 0 % (ref 0.0–0.2)

## 2023-10-21 LAB — HIV ANTIBODY (ROUTINE TESTING W REFLEX): HIV Screen 4th Generation wRfx: NONREACTIVE

## 2023-10-21 LAB — ANA W/REFLEX IF POSITIVE: Anti Nuclear Antibody (ANA): NEGATIVE

## 2023-10-21 LAB — MITOCHONDRIAL ANTIBODIES: Mitochondrial M2 Ab, IgG: <20 U (ref 0.0–20.0)

## 2023-10-21 LAB — ANTI-SMOOTH MUSCLE ANTIBODY, IGG: F-Actin IgG: 7 U (ref 0–19)

## 2023-10-21 NOTE — Progress Notes (Signed)
 Subjective: Sheila Little is a 38 y/o female on hospital day three presenting for evaluation of her episode of severe abdominal pain on 7/20. She is feeling well and has not had any issues since the night her symptoms brought her in. She denies any nausea or vomiting ,stomach pain, or issues going to the bathroom. No significant overnight events.  Objective: Vital signs in last 24 hours: Temp:  [97.8 F (36.6 C)-98.3 F (36.8 C)] 98.1 F (36.7 C) (07/22 1213) Pulse Rate:  [62-88] 88 (07/22 1213) Resp:  [16-20] 18 (07/22 1213) BP: (104-112)/(66-75) 112/71 (07/22 1213) SpO2:  [99 %-100 %] 100 % (07/22 1213) Last BM Date : 10/19/23  Intake/Output from previous day: 07/21 0701 - 07/22 0700 In: 820.7 [P.O.:120; I.V.:700.7] Out: -  Intake/Output this shift: Total I/O In: 240 [P.O.:240] Out: -   Physical Exam Constitutional:      Appearance: Normal appearance. She is normal weight.  HENT:     Head: Normocephalic and atraumatic.     Nose: Nose normal.     Mouth/Throat:     Mouth: Mucous membranes are moist.     Pharynx: Oropharynx is clear. No oropharyngeal exudate.  Eyes:     Extraocular Movements: Extraocular movements intact.     Pupils: Pupils are equal, round, and reactive to light.  Cardiovascular:     Rate and Rhythm: Normal rate.     Pulses: Normal pulses.  Pulmonary:     Effort: Pulmonary effort is normal.     Breath sounds: Normal breath sounds.  Abdominal:     General: Abdomen is flat. Bowel sounds are normal. There is no distension.     Palpations: Abdomen is soft.  Musculoskeletal:        General: Normal range of motion.  Skin:    General: Skin is warm and dry.     Coloration: Skin is not jaundiced.     Findings: No erythema.  Neurological:     General: No focal deficit present.     Mental Status: She is alert.  Psychiatric:        Mood and Affect: Mood normal.      Lab Results:  Recent Labs    10/20/23 0348 10/21/23 0438  WBC 4.6 4.0  HGB  11.4* 11.2*  HCT 34.4* 33.7*  PLT 166 161   BMET Recent Labs    10/20/23 0348 10/21/23 0438  NA 136 140  K 3.7 3.7  CL 106 106  CO2 23 24  GLUCOSE 100* 87  BUN 12 10  CREATININE 0.87 0.74  CALCIUM 8.1* 7.9*   PT/INR Recent Labs    10/20/23 1333  LABPROT 15.2  INR 1.1    Studies/Results: US  ABDOMEN LIMITED WITH LIVER DOPPLER Result Date: 10/21/2023 CLINICAL DATA:  Right upper quadrant pain and abnormal liver function tests. EXAM: DUPLEX ULTRASOUND OF LIVER TECHNIQUE: Color and duplex Doppler ultrasound was performed to evaluate the hepatic in-flow and out-flow vessels. COMPARISON:  None Available. FINDINGS: Liver: Normal parenchymal echogenicity. Normal hepatic contour without nodularity. No focal lesion, mass or intrahepatic biliary ductal dilatation. Gallbladder: No gallstones, gallbladder wall thickening or sonographic Murphy's sign. Trace free fluid in the gallbladder fossa. Main Portal Vein size: 1.0 cm Portal Vein Velocities Main Prox:  44 cm/sec Main Mid: 55 cm/sec Main Dist:  47 cm/sec Right: 35 cm/sec Left: 24 cm/sec Hepatic Vein Velocities Right:  36 cm/sec Middle:  27 cm/sec Left:  35 cm/sec IVC: Present and patent with normal respiratory phasicity. Hepatic Artery Velocity:  63 cm/sec Portal Vein Occlusion/Thrombus: No Ascites: None Varices: None Direction of portal vein flow is towards the liver. No evidence of portal vein thrombus. No evidence of hepatic veno-occlusive disease. Portal vein and hepatic vein waveforms are within normal limits. IMPRESSION: 1. Normal hepatic duplex ultrasound. 2. Trace free fluid in the gallbladder fossa. No evidence of cholelithiasis, gallbladder wall thickening or sonographic Murphy's sign. 3. No evidence of biliary obstruction. 4. Normal sonographic appearance of the liver. Electronically Signed   By: Marcey Moan M.D.   On: 10/21/2023 08:31   NM Hepato W/EF Result Date: 10/20/2023 CLINICAL DATA:  Right upper quadrant abdominal pain,  biliary disease suspected EXAM: NUCLEAR MEDICINE HEPATOBILIARY IMAGING WITH GALLBLADDER EF TECHNIQUE: Sequential images of the abdomen were obtained out to 60 minutes following intravenous administration of radiopharmaceutical. After slow intravenous infusion of 1.2 micrograms Cholecystokinin, gallbladder ejection fraction was determined. RADIOPHARMACEUTICALS:  5.2 mCi Tc-62m Choletec  IV COMPARISON:  Abdominopelvic CT 10/19/2023 FINDINGS: Prompt uptake and biliary excretion of activity by the liver is seen. Gallbladder activity is visualized, consistent with patency of cystic duct. Biliary activity passes into small bowel, consistent with patent common bile duct. Calculated gallbladder ejection fraction is 78%. (At 60 min, normal ejection fraction is greater than 40% and less than 80%.) IMPRESSION: 1. Normal hepatobiliary scan. The cystic and common bile ducts are patent. 2. Normal gallbladder ejection fraction of 78%. 3. Periportal edema on previous CT, suggesting hepatitis. Electronically Signed   By: Elsie Perone M.D.   On: 10/20/2023 13:40   CT ABDOMEN PELVIS W CONTRAST Result Date: 10/19/2023 CLINICAL DATA:  Epigastric pain L side chest pain that radiates to her back that started about 1 hour ago. Also reports nausea. Last meal was around 1800 and reports it was stew that was a little spicy, but nothing abnormal that she has not eaten before. EXAM: CT ABDOMEN AND PELVIS WITH CONTRAST TECHNIQUE: Multidetector CT imaging of the abdomen and pelvis was performed using the standard protocol following bolus administration of intravenous contrast. RADIATION DOSE REDUCTION: This exam was performed according to the departmental dose-optimization program which includes automated exposure control, adjustment of the mA and/or kV according to patient size and/or use of iterative reconstruction technique. CONTRAST:  OMNIPAQUE  IOHEXOL  300 MG/ML  SOLN COMPARISON:  None Available. FINDINGS: Lower chest: No acute  abnormality. Hepatobiliary: The liver is enlarged measuring up to 20 cm. No focal liver abnormality. Question cholelithiasis (4:35). Gallbladder wall thickening and pericholecystic fluid. Question portal edema. No biliary dilatation. Pancreas: No focal lesion. Normal pancreatic contour. No surrounding inflammatory changes. No main pancreatic ductal dilatation. Spleen: Normal in size without focal abnormality. Adrenals/Urinary Tract: No adrenal nodule bilaterally. Bilateral kidneys enhance symmetrically. No hydronephrosis. No hydroureter. The urinary bladder is unremarkable. Stomach/Bowel: Stomach is within normal limits. No evidence of bowel wall thickening or dilatation. Stool throughout the colon. Appendix appears normal. Vascular/Lymphatic: The main portal, splenic, superior mesenteric vein are patent. No abdominal aorta or iliac aneurysm. No abdominal, pelvic, or inguinal lymphadenopathy. Reproductive: Uterus and bilateral adnexa are unremarkable. Tampon within the vagina. Other: No intraperitoneal free fluid. No intraperitoneal free gas. No organized fluid collection. Musculoskeletal: No abdominal wall hernia or abnormality. No suspicious lytic or blastic osseous lesions. No acute displaced fracture. IMPRESSION: 1. Gallbladder wall thickening and pericholecystic fluid. Question cholelithiasis. Recommend right upper quadrant ultrasound for further evaluation. 2. Stool throughout the colon.  Correlate for constipation. 3. Hepatomegaly. Electronically Signed   By: Morgane  Naveau M.D.   On: 10/19/2023 23:07  Anti-infectives: Anti-infectives (From admission, onward)    Start     Dose/Rate Route Frequency Ordered Stop   10/19/23 2330  piperacillin -tazobactam (ZOSYN ) IVPB 3.375 g        3.375 g 100 mL/hr over 30 Minutes Intravenous  Once 10/19/23 2317 10/20/23 0202       Assessment/Plan: Impression: Biliary colic with passage of gallstone  Assessment: The patient is overall feeling very well today.  Her LFTs are down trending and her hepatitis panel came back negative. We are still waiting on a few elements of the autoimmune workup but imagine these are likely to be negative. We discussed that the most likely cause for her symptoms would be the passage of a gallstone/transient obstruction with thicker bile. Given negative HIDA and RUQ US , we feel confident there is no acute obstruction requiring surgery in the immediate future. Because of her family history and because she has had episodes of this pain in the past, definitive surgical management was discussed. Risks and benefits were discussed with the patient and she wishes to proceed with robotic-assisted laparoscopic cholecystectomy in early August after her child's birthday.  Plan: Pending negative autoimmune workup, schedule robotic-assisted laparoscopic cholecystectomy with Dr. Kallie. Patient is okay by surgery team for discharge today.  Discharge   LOS: 2 days    Leonor JAYSON Clause 10/21/2023

## 2023-10-21 NOTE — Progress Notes (Signed)
 Subjective: Reported she was feeling well, but as I was talking with her, states she was starting to have recurrent upper abdominal pain. No nausea. Had 2 pancakes with a small amount of syrup. States she may need to have a bowel movement as well.   Objective: Vital signs in last 24 hours: Temp:  [97.8 F (36.6 C)-98.3 F (36.8 C)] 98.3 F (36.8 C) (07/22 0537) Pulse Rate:  [62-79] 65 (07/22 0537) Resp:  [16-20] 16 (07/22 0537) BP: (104-112)/(66-75) 110/66 (07/22 0537) SpO2:  [99 %-100 %] 100 % (07/22 0537) Last BM Date : 10/19/23 General:   Alert and oriented, pleasant, NAD.  Head:  Normocephalic and atraumatic. Eyes:  No icterus, sclera clear. Conjuctiva pink.  Abdomen:  Bowel sounds present, soft, non-distended. Minimal TTP in RUQ, mild TTP in epigastric and suprapubic area. No rebound or guarding. No masses appreciated  Msk:  Symmetrical without gross deformities. Normal posture. Extremities:  Without edema. Neurologic:  Alert and  oriented x4;  grossly normal neurologically. Skin:  Warm and dry, intact without significant lesions.  Psych:  Normal mood and affect.  Intake/Output from previous day: 07/21 0701 - 07/22 0700 In: 820.7 [P.O.:120; I.V.:700.7] Out: -  Intake/Output this shift: Total I/O In: 240 [P.O.:240] Out: -   Lab Results: Recent Labs    10/19/23 2110 10/20/23 0348 10/21/23 0438  WBC 10.5 4.6 4.0  HGB 11.8* 11.4* 11.2*  HCT 34.3* 34.4* 33.7*  PLT 181 166 161   BMET Recent Labs    10/19/23 2110 10/20/23 0348 10/21/23 0438  NA 132* 136 140  K 3.6 3.7 3.7  CL 100 106 106  CO2 22 23 24   GLUCOSE 95 100* 87  BUN 15 12 10   CREATININE 1.04* 0.87 0.74  CALCIUM 8.3* 8.1* 7.9*   LFT Recent Labs    10/19/23 2110 10/20/23 0348 10/21/23 0438  PROT 6.3* 5.9* 5.5*  ALBUMIN 3.5 3.3* 3.0*  AST 129* 780* 181*  ALT 76* 710* 390*  ALKPHOS 70 78 65  BILITOT 0.8 1.0 0.4  BILIDIR 0.2  --   --   IBILI 0.6  --   --    PT/INR Recent Labs     10/20/23 1333  LABPROT 15.2  INR 1.1   Hepatitis Panel Recent Labs    10/20/23 1333  HEPBSAG NON REACTIVE  HCVAB NON REACTIVE  HEPAIGM NON REACTIVE  HEPBIGM NON REACTIVE     Studies/Results: US  ABDOMEN LIMITED WITH LIVER DOPPLER Result Date: 10/21/2023 CLINICAL DATA:  Right upper quadrant pain and abnormal liver function tests. EXAM: DUPLEX ULTRASOUND OF LIVER TECHNIQUE: Color and duplex Doppler ultrasound was performed to evaluate the hepatic in-flow and out-flow vessels. COMPARISON:  None Available. FINDINGS: Liver: Normal parenchymal echogenicity. Normal hepatic contour without nodularity. No focal lesion, mass or intrahepatic biliary ductal dilatation. Gallbladder: No gallstones, gallbladder wall thickening or sonographic Murphy's sign. Trace free fluid in the gallbladder fossa. Main Portal Vein size: 1.0 cm Portal Vein Velocities Main Prox:  44 cm/sec Main Mid: 55 cm/sec Main Dist:  47 cm/sec Right: 35 cm/sec Left: 24 cm/sec Hepatic Vein Velocities Right:  36 cm/sec Middle:  27 cm/sec Left:  35 cm/sec IVC: Present and patent with normal respiratory phasicity. Hepatic Artery Velocity:  63 cm/sec Portal Vein Occlusion/Thrombus: No Ascites: None Varices: None Direction of portal vein flow is towards the liver. No evidence of portal vein thrombus. No evidence of hepatic veno-occlusive disease. Portal vein and hepatic vein waveforms are within normal limits. IMPRESSION: 1. Normal  hepatic duplex ultrasound. 2. Trace free fluid in the gallbladder fossa. No evidence of cholelithiasis, gallbladder wall thickening or sonographic Murphy's sign. 3. No evidence of biliary obstruction. 4. Normal sonographic appearance of the liver. Electronically Signed   By: Marcey Moan M.D.   On: 10/21/2023 08:31   NM Hepato W/EF Result Date: 10/20/2023 CLINICAL DATA:  Right upper quadrant abdominal pain, biliary disease suspected EXAM: NUCLEAR MEDICINE HEPATOBILIARY IMAGING WITH GALLBLADDER EF TECHNIQUE:  Sequential images of the abdomen were obtained out to 60 minutes following intravenous administration of radiopharmaceutical. After slow intravenous infusion of 1.2 micrograms Cholecystokinin, gallbladder ejection fraction was determined. RADIOPHARMACEUTICALS:  5.2 mCi Tc-20m Choletec  IV COMPARISON:  Abdominopelvic CT 10/19/2023 FINDINGS: Prompt uptake and biliary excretion of activity by the liver is seen. Gallbladder activity is visualized, consistent with patency of cystic duct. Biliary activity passes into small bowel, consistent with patent common bile duct. Calculated gallbladder ejection fraction is 78%. (At 60 min, normal ejection fraction is greater than 40% and less than 80%.) IMPRESSION: 1. Normal hepatobiliary scan. The cystic and common bile ducts are patent. 2. Normal gallbladder ejection fraction of 78%. 3. Periportal edema on previous CT, suggesting hepatitis. Electronically Signed   By: Elsie Perone M.D.   On: 10/20/2023 13:40   CT ABDOMEN PELVIS W CONTRAST Result Date: 10/19/2023 CLINICAL DATA:  Epigastric pain L side chest pain that radiates to her back that started about 1 hour ago. Also reports nausea. Last meal was around 1800 and reports it was stew that was a little spicy, but nothing abnormal that she has not eaten before. EXAM: CT ABDOMEN AND PELVIS WITH CONTRAST TECHNIQUE: Multidetector CT imaging of the abdomen and pelvis was performed using the standard protocol following bolus administration of intravenous contrast. RADIATION DOSE REDUCTION: This exam was performed according to the departmental dose-optimization program which includes automated exposure control, adjustment of the mA and/or kV according to patient size and/or use of iterative reconstruction technique. CONTRAST:  OMNIPAQUE  IOHEXOL  300 MG/ML  SOLN COMPARISON:  None Available. FINDINGS: Lower chest: No acute abnormality. Hepatobiliary: The liver is enlarged measuring up to 20 cm. No focal liver abnormality.  Question cholelithiasis (4:35). Gallbladder wall thickening and pericholecystic fluid. Question portal edema. No biliary dilatation. Pancreas: No focal lesion. Normal pancreatic contour. No surrounding inflammatory changes. No main pancreatic ductal dilatation. Spleen: Normal in size without focal abnormality. Adrenals/Urinary Tract: No adrenal nodule bilaterally. Bilateral kidneys enhance symmetrically. No hydronephrosis. No hydroureter. The urinary bladder is unremarkable. Stomach/Bowel: Stomach is within normal limits. No evidence of bowel wall thickening or dilatation. Stool throughout the colon. Appendix appears normal. Vascular/Lymphatic: The main portal, splenic, superior mesenteric vein are patent. No abdominal aorta or iliac aneurysm. No abdominal, pelvic, or inguinal lymphadenopathy. Reproductive: Uterus and bilateral adnexa are unremarkable. Tampon within the vagina. Other: No intraperitoneal free fluid. No intraperitoneal free gas. No organized fluid collection. Musculoskeletal: No abdominal wall hernia or abnormality. No suspicious lytic or blastic osseous lesions. No acute displaced fracture. IMPRESSION: 1. Gallbladder wall thickening and pericholecystic fluid. Question cholelithiasis. Recommend right upper quadrant ultrasound for further evaluation. 2. Stool throughout the colon.  Correlate for constipation. 3. Hepatomegaly. Electronically Signed   By: Morgane  Naveau M.D.   On: 10/19/2023 23:07    Assessment: 38 y.o. year old female with no significant medical history who presented to the ED with chest/epigastric pain radiating to the back, subsequently with significant rise in her aminotransferases, GI consulted for further evaluation.   Elevated LFTs/hepatomegaly/epigastric pain: - Acute onset  epigastric abdominal pain with radiation across upper abdomen and to her back evening of 7/20 that resolved on 7/21.  - Initial labs with AST 129, ALT 76, Alk phos 70, T Bili 0.8  - 7/21 with AST  780, ALT 710, alk phos 78, total bilirubin 1.0, plt count 166k  - 7/22 with AST 181, ALT 390, alk phos 65, total bilirubin 0.4 - CT A/P with contrast 7/20 with gallbladder wall thickening, pericholecystic fluid, question cholelithiasis, hepatomegaly, question periportal edema. -HIDA on 7/20 was normal. -Ultrasound with liver Doppler today with normal-appearing liver, trace free fluid in the gallbladder fossa without cholelithiasis or gallbladder wall thickening.  No biliary dilation.  Patent portal vein. - Endorses use of CBD, functional mushrooms, no ilicit drugs/new Rx, no alcohol  - Acute hepatitis panel negative. - ANA negative.  AMA and ASMA are pending. - Acetaminophen  level less than 10.  Etiology seems most consistent with transient biliary obstruction/passage of stone or sludge through CBD patient also presented with acute onset epigastric abdominal pain that is also resolved.  General surgery is on board and is agreeable to pursuing cholecystectomy as long as other autoimmune hepatitis labs come back negative.   Clinically, patient is doing fairly well at this time.  She is having some recurrence of very mild epigastric pain since eating pancakes this morning.  Suspect this is likely related to biliary colic.  Hopefully, this pain will resolve within the next hour or so.  If this is the case, I think it would be okay for patient to be discharged home with strict precautions to follow a bland diet until undergoing cholecystectomy which is tentatively scheduled for 11/03/2023 with Dr. Kallie.    Plan: Follow-up on pending labs - ASMA, AMA Avoid hepatic toxic medications. Continue to trend LFTs Can continue daily PPI. Agree with cholecystectomy pending normal results for autoimmune labs that are in process.  We have recommended liver biopsy at the time of cholecystectomy.  Hopeful discharge home today as long as patient's current mild pain subsides.  Bland diet.    LOS: 2 days     10/21/2023, 10:55 AM   Josette Centers, PA-C [facility]

## 2023-10-21 NOTE — Discharge Summary (Signed)
 Physician Discharge Summary   Patient: Sheila Little MRN: [id number] DOB: [birth year]  Admit date:     10/19/2023  Discharge date: 10/21/23  Discharge Physician: Eric Nunnery   PCP: Toribio Jerel MATSU, MD   Recommendations at discharge:  Repeat comprehensive metabolic panel to follow electrolytes, renal function and LFTs Make sure patient follow-up with general surgery as discussed. Continue to reassess ongoing epigastric discomfort and determine the need for PPI therapy.  Discharge Diagnoses: Principal Problem:   Transaminitis Active Problems:   EPIGASTRIC PAIN, CHRONIC   Acute cholecystitis  Brief hospital admission narrative : As per H&P written by Dr. Shona on 10/19/2023 Sheila Little is a 38 y.o. female with no significant medical history who presents to the ER due to severe right upper quadrant abdominal pain.  Symptoms started about 2 months ago and have been intermittent.  Associated with chills for the past 24 hours.  No vomiting or diarrhea.   In the ER, tachypneic.  CT abdomen and pelvis with contrast revealed gallbladder wall thickening and pericholecystic fluid.  Question cholelithiasis.  Recommend right upper quadrant ultrasound for further evaluation.  Stool throughout the colon correlate for constipation.  Hepatomegaly.  AST and ALT elevated.   Symptoms are improved at the time of the visit.  The patient was started on empiric IV antibiotics for acute cholecystitis.  General surgery was consulted by EDP.  Admitted by [facility], hospitalist service.   ED Course: Temperature 97.5.  BP 117/67, pulse 86, respiration rate 18, O2 saturation 100% on room air.  Assessment and Plan: 1-transaminitis/epigastric abdominal pain/cholelithiasis and biliary colic. - Initially with concerns for cholelithiasis with possible cholecystitis - HIDA scan demonstrating normal-appearing gallbladder - No fever, no elevated WBCs, and normal bilirubin and alk phos levels. - GI service has been  consulted for further evaluation and management of transaminitis process; and after further discussing with patient there was concern about maybe passing a stone triggering this changes - Patient LFTs has improved without much of intervention - Acute hepatitis panel negative -Other antibody test ordered by GI service pending and they will follow results - Patient will be discharged with follow-up by general surgery for elective cholecystectomy down the road. - Continue PPI -Patient advised to maintain adequate hydration and to follow low-fat diet.  2-mild hyponatremia - In the setting of dehydration and decreased oral intake -Resolved after fluid resuscitation - Repeat metabolic panel to follow trend of follow-up visit.   3-presumed GERD -Patient educated about lifestyle changes and planning to use as needed PPI if symptoms persist. - Depending clinical response might benefit of endoscopic evaluation at some point.  Consultants: Gastroenterology and general surgery. Procedures performed: See below for x-ray reports. Disposition: Home Diet recommendation: Low-fat diet.  DISCHARGE MEDICATION: Allergies as of 10/21/2023       Reactions   Sumatriptan         Medication List     TAKE these medications    ibuprofen  600 MG tablet Commonly known as: ADVIL  Take 600 mg by mouth every 6 (six) hours as needed for moderate pain (pain score 4-6).        Follow-up Information     Toribio Jerel MATSU, MD. Schedule an appointment as soon as possible for a visit in 2 week(s).   Specialty: Family Medicine Contact information: [address] [place] [address] [phone]                Discharge Exam: Sheila Little   10/19/23 2024  Weight:  59 kg   General exam: Alert, awake, oriented x 3 Respiratory system: Clear to auscultation. Respiratory effort normal. Cardiovascular system:RRR. No murmurs, rubs, gallops. Gastrointestinal system: Abdomen is nondistended, soft and  nontender. No organomegaly or masses felt. Normal bowel sounds heard. Central nervous system: Alert and oriented. No focal neurological deficits. Extremities: No C/C/E, +pedal pulses Skin: No rashes, lesions or ulcers Psychiatry: Judgement and insight appear normal. Mood & affect appropriate.    Condition at discharge: Stable and improved.  The results of significant diagnostics from this hospitalization (including imaging, microbiology, ancillary and laboratory) are listed below for reference.   Imaging Studies: US  ABDOMEN LIMITED WITH LIVER DOPPLER Result Date: 10/21/2023 CLINICAL DATA:  Right upper quadrant pain and abnormal liver function tests. EXAM: DUPLEX ULTRASOUND OF LIVER TECHNIQUE: Color and duplex Doppler ultrasound was performed to evaluate the hepatic in-flow and out-flow vessels. COMPARISON:  None Available. FINDINGS: Liver: Normal parenchymal echogenicity. Normal hepatic contour without nodularity. No focal lesion, mass or intrahepatic biliary ductal dilatation. Gallbladder: No gallstones, gallbladder wall thickening or sonographic Murphy's sign. Trace free fluid in the gallbladder fossa. Main Portal Vein size: 1.0 cm Portal Vein Velocities Main Prox:  44 cm/sec Main Mid: 55 cm/sec Main Dist:  47 cm/sec Right: 35 cm/sec Left: 24 cm/sec Hepatic Vein Velocities Right:  36 cm/sec Middle:  27 cm/sec Left:  35 cm/sec IVC: Present and patent with normal respiratory phasicity. Hepatic Artery Velocity:  63 cm/sec Portal Vein Occlusion/Thrombus: No Ascites: None Varices: None Direction of portal vein flow is towards the liver. No evidence of portal vein thrombus. No evidence of hepatic veno-occlusive disease. Portal vein and hepatic vein waveforms are within normal limits. IMPRESSION: 1. Normal hepatic duplex ultrasound. 2. Trace free fluid in the gallbladder fossa. No evidence of cholelithiasis, gallbladder wall thickening or sonographic Murphy's sign. 3. No evidence of biliary obstruction. 4.  Normal sonographic appearance of the liver. Electronically Signed   By: Marcey Moan M.D.   On: 10/21/2023 08:31   NM Hepato W/EF Result Date: 10/20/2023 CLINICAL DATA:  Right upper quadrant abdominal pain, biliary disease suspected EXAM: NUCLEAR MEDICINE HEPATOBILIARY IMAGING WITH GALLBLADDER EF TECHNIQUE: Sequential images of the abdomen were obtained out to 60 minutes following intravenous administration of radiopharmaceutical. After slow intravenous infusion of 1.2 micrograms Cholecystokinin, gallbladder ejection fraction was determined. RADIOPHARMACEUTICALS:  5.2 mCi Tc-41m Choletec  IV COMPARISON:  Abdominopelvic CT 10/19/2023 FINDINGS: Prompt uptake and biliary excretion of activity by the liver is seen. Gallbladder activity is visualized, consistent with patency of cystic duct. Biliary activity passes into small bowel, consistent with patent common bile duct. Calculated gallbladder ejection fraction is 78%. (At 60 min, normal ejection fraction is greater than 40% and less than 80%.) IMPRESSION: 1. Normal hepatobiliary scan. The cystic and common bile ducts are patent. 2. Normal gallbladder ejection fraction of 78%. 3. Periportal edema on previous CT, suggesting hepatitis. Electronically Signed   By: Elsie Perone M.D.   On: 10/20/2023 13:40   CT ABDOMEN PELVIS W CONTRAST Result Date: 10/19/2023 CLINICAL DATA:  Epigastric pain L side chest pain that radiates to her back that started about 1 hour ago. Also reports nausea. Last meal was around 1800 and reports it was stew that was a little spicy, but nothing abnormal that she has not eaten before. EXAM: CT ABDOMEN AND PELVIS WITH CONTRAST TECHNIQUE: Multidetector CT imaging of the abdomen and pelvis was performed using the standard protocol following bolus administration of intravenous contrast. RADIATION DOSE REDUCTION: This exam was performed according to the  departmental dose-optimization program which includes automated exposure control,  adjustment of the mA and/or kV according to patient size and/or use of iterative reconstruction technique. CONTRAST:  OMNIPAQUE  IOHEXOL  300 MG/ML  SOLN COMPARISON:  None Available. FINDINGS: Lower chest: No acute abnormality. Hepatobiliary: The liver is enlarged measuring up to 20 cm. No focal liver abnormality. Question cholelithiasis (4:35). Gallbladder wall thickening and pericholecystic fluid. Question portal edema. No biliary dilatation. Pancreas: No focal lesion. Normal pancreatic contour. No surrounding inflammatory changes. No main pancreatic ductal dilatation. Spleen: Normal in size without focal abnormality. Adrenals/Urinary Tract: No adrenal nodule bilaterally. Bilateral kidneys enhance symmetrically. No hydronephrosis. No hydroureter. The urinary bladder is unremarkable. Stomach/Bowel: Stomach is within normal limits. No evidence of bowel wall thickening or dilatation. Stool throughout the colon. Appendix appears normal. Vascular/Lymphatic: The main portal, splenic, superior mesenteric vein are patent. No abdominal aorta or iliac aneurysm. No abdominal, pelvic, or inguinal lymphadenopathy. Reproductive: Uterus and bilateral adnexa are unremarkable. Tampon within the vagina. Other: No intraperitoneal free fluid. No intraperitoneal free gas. No organized fluid collection. Musculoskeletal: No abdominal wall hernia or abnormality. No suspicious lytic or blastic osseous lesions. No acute displaced fracture. IMPRESSION: 1. Gallbladder wall thickening and pericholecystic fluid. Question cholelithiasis. Recommend right upper quadrant ultrasound for further evaluation. 2. Stool throughout the colon.  Correlate for constipation. 3. Hepatomegaly. Electronically Signed   By: Morgane  Naveau M.D.   On: 10/19/2023 23:07    Microbiology: Results for orders placed or performed during the hospital encounter of 12/29/19  Respiratory Panel by RT PCR (Flu A&B, Covid) - Nasopharyngeal Swab     Status: None    Collection Time: 12/29/19  7:58 PM   Specimen: Nasopharyngeal Swab  Result Value Ref Range Status   SARS Coronavirus 2 by RT PCR NEGATIVE NEGATIVE Final    Comment: (NOTE) SARS-CoV-2 target nucleic acids are NOT DETECTED.  The SARS-CoV-2 RNA is generally detectable in upper respiratoy specimens during the acute phase of infection. The lowest concentration of SARS-CoV-2 viral copies this assay can detect is 131 copies/mL. A negative result does not preclude SARS-Cov-2 infection and should not be used as the sole basis for treatment or other patient management decisions. A negative result may occur with  improper specimen collection/handling, submission of specimen other than nasopharyngeal swab, presence of viral mutation(s) within the areas targeted by this assay, and inadequate number of viral copies (<131 copies/mL). A negative result must be combined with clinical observations, patient history, and epidemiological information. The expected result is Negative.  Fact Sheet for Patients:  https://www.moore.com/  Fact Sheet for Healthcare Providers:  https://www.young.biz/  This test is no t yet approved or cleared by the [place]  FDA and  has been authorized for detection and/or diagnosis of SARS-CoV-2 by FDA under an Emergency Use Authorization (EUA). This EUA will remain  in effect (meaning this test can be used) for the duration of the COVID-19 declaration under Section 564(b)(1) of the Act, 21 U.S.C. section 360bbb-3(b)(1), unless the authorization is terminated or revoked sooner.     Influenza A by PCR NEGATIVE NEGATIVE Final   Influenza B by PCR NEGATIVE NEGATIVE Final    Comment: (NOTE) The Xpert Xpress SARS-CoV-2/FLU/RSV assay is intended as an aid in  the diagnosis of influenza from Nasopharyngeal swab specimens and  should not be used as a sole basis for treatment. Nasal washings and  aspirates are unacceptable for Xpert  Xpress SARS-CoV-2/FLU/RSV  testing.  Fact Sheet for Patients: https://www.moore.com/  Fact Sheet for Healthcare Providers:  https://www.young.biz/  This test is not yet approved or cleared by the [place]  FDA and  has been authorized for detection and/or diagnosis of SARS-CoV-2 by  FDA under an Emergency Use Authorization (EUA). This EUA will remain  in effect (meaning this test can be used) for the duration of the  Covid-19 declaration under Section 564(b)(1) of the Act, 21  U.S.C. section 360bbb-3(b)(1), unless the authorization is  terminated or revoked. Performed at [facility], [address], [place], [place] [address]     Labs: CBC: Recent Labs  Lab 10/19/23 2110 10/20/23 0348 10/21/23 0438  WBC 10.5 4.6 4.0  NEUTROABS  --  3.2  --   HGB 11.8* 11.4* 11.2*  HCT 34.3* 34.4* 33.7*  MCV 91.5 93.7 94.1  PLT 181 166 161   Basic Metabolic Panel: Recent Labs  Lab 10/19/23 2110 10/20/23 0348 10/21/23 0438  NA 132* 136 140  K 3.6 3.7 3.7  CL 100 106 106  CO2 22 23 24   GLUCOSE 95 100* 87  BUN 15 12 10   CREATININE 1.04* 0.87 0.74  CALCIUM 8.3* 8.1* 7.9*  MG  --  1.9  --   PHOS  --  4.1  --    Liver Function Tests: Recent Labs  Lab 10/19/23 2110 10/20/23 0348 10/21/23 0438  AST 129* 780* 181*  ALT 76* 710* 390*  ALKPHOS 70 78 65  BILITOT 0.8 1.0 0.4  PROT 6.3* 5.9* 5.5*  ALBUMIN 3.5 3.3* 3.0*   CBG: No results for input(s): GLUCAP in the last 168 hours.  Discharge time spent:  28 minutes.  Signed: Eric Nunnery, MD [facility] 10/21/2023

## 2023-10-21 NOTE — Telephone Encounter (Signed)
 Spoke to pt, informed her of recommendations. She voiced understanding. Labs entered into Epic.

## 2023-10-21 NOTE — Telephone Encounter (Signed)
 Received orders from Dr. Kallie to schedule for outpatient surgery for cholecystectomy and liver biopsy.   Surgery scheduled.

## 2023-10-21 NOTE — Telephone Encounter (Signed)
 Patient getting discharged from the hospital today. I would like to recheck her liver enzymes Thursday or Friday this week if possible. Please let patient know and arrange HFP.

## 2023-10-22 DIAGNOSIS — K8050 Calculus of bile duct without cholangitis or cholecystitis without obstruction: Secondary | ICD-10-CM

## 2023-10-27 ENCOUNTER — Telehealth (INDEPENDENT_AMBULATORY_CARE_PROVIDER_SITE_OTHER): Admitting: General Surgery

## 2023-10-27 DIAGNOSIS — R1013 Epigastric pain: Secondary | ICD-10-CM

## 2023-10-27 DIAGNOSIS — R7401 Elevation of levels of liver transaminase levels: Secondary | ICD-10-CM

## 2023-10-27 NOTE — H&P (Signed)
 History and Physical Note   Addendum:  10/27/23 Called patient to discuss labwork. Labwork from GI panel for hepatitis and immune hepatitis all negative. The best hypothesis is that she passed a stone, versus sandy gravel stone. Discussed that she is at risk for cholangitis/ pancreatitis. She is nervous about getting surgery but wants to proceed. We have already discussed the risk/ benefits of surgery. Discussed robotic assisted cholecystostomy and liver biopsy (GI agreed).    She wants to move surgery to 8/11.   [name], MD [facility] [address] [place], [place] [address] [phone] (office)   I was present with the medical student for this service. I personally verified the history of present illness, performed the physical exam, and made the plan for this encounter. I have verified the medical student's documentation and made modifications where appropriately. I have personally documented in my own words a brief history, physical, and plan below.      Patient with acute onset epigastric pain to the back, says it is like a band around her lower chest upper abdomen. Very large liver on CT scan, some minor periportal edema. HIDA negative for acute cholecystitis and normal EF.   Her LFTs jumped with her AST/ ALT going up to 700+ but normal alk phos and bilirubin. This would be unusual if this is truly gallbladder etiology, and even if she passed a stone you would expect Bili/ Alk phos to go up some in proportion to the AST/ALT. Lipase was normal.    Abd soft, mild tenderness epigastric region, no RUQ tenderness    I have asked GI to weigh in. Hepatitis panel ordered.    No indication right now for cholecystectomy. I discussed all the above with the patient and personally reviewed her imaging and labs and showed her both imaging and labwork. Discussed her having a diet. She is having no nausea or pain now. No nsaid use.    Regular diet GI  recommendations appreciated Discussed that this could be her gallbladder but that  right now that is not clear and we want to be sure.  LFTs in the AM No indication for further antibiotics I have changed the problem list from acute cholecystitis to Transaminitis and epigastric pain    [name], MD [facility] [address] [place], [place] [address] [phone] (office)       Reason for Consult: Acute Abdominal Pain/Evaluate for Cholecystitis  Referring Physician: Terry Hurst, MD   HPI:   Sheila Little is an 38 y.o. female presenting today for evaluation of sharp epigastric abdominal pain that radiates to her back. She has had similar episodes in the past 4-6 months where it will be a 6-7/10 pain but something she can push through. These episodes are not improved with rest and just have to go away. Her pain is made worse by bending over when the pain is already starting. The pain is associated with nausea but she has not had vomiting. No unintentional weight loss. Yesterday evening, she had an episode of this epigastric pain that was 10/10 and radiating to her back. She states it feels like she was hit across her midsection with a baseball bat which is what brought her to the ED. Cardiac and pancreatitis workup were negative and abdominal CT showed gallbladder inflammation with question as to the presence of gallstones. She does have a family history of father and uncle both having their gallbladder removed when they were in their thirties  despite being otherwise healthy. She was admitted and started on IV antibiotics for presumed cholecystitis. This morning she denies any abdominal pain whatsoever stating the skin tape on her IV tugging hurts more than any belly pain. She denies any significant overnight events.          Past Medical History:  Diagnosis Date   CHEST WALL PAIN, ANTERIOR 08/02/2009    Qualifier: Diagnosis of  By: Avram MD, NOLIA [name]    EPIGASTRIC PAIN, CHRONIC 08/02/2009    Qualifier: Diagnosis of  By: Avram MD, NOLIA [name]    Nonspecific (abnormal) findings on radiological and other examination of other intrathoracic organs 07/24/2009    Centricity Description: ECHOCARDIOGRAM, ABNORMAL Qualifier: Diagnosis of  By: Cherrie, MD, CODY Toribio SAUNDERS  Centricity Description: ABNORMAL ECHOCARDIOGRAM Qualifier: Diagnosis of  By: Cherrie, MD, CODY Toribio SAUNDERS    Precordial pain 07/24/2009    Qualifier: Diagnosis of  By: Cherrie, MD, CODY Toribio SAUNDERS                Past Surgical History:  Procedure Laterality Date   WISDOM TOOTH EXTRACTION                   Family History  Problem Relation Age of Onset   Migraines Mother            Social History:  reports that she has never smoked. She has never used smokeless tobacco. She reports that she does not drink alcohol and does not use drugs.   Allergies:  Allergies      Allergies  Allergen Reactions   Sumatriptan          Medications: I have reviewed the patient's current medications.          Current Facility-Administered Medications  Medication Dose Route Frequency Provider Last Rate Last Admin   ketorolac  (TORADOL ) 15 MG/ML injection 15 mg  15 mg Intravenous Q8H PRN Shona Terry SAILOR, DO       lactated ringers  infusion   Intravenous Continuous Shona Terry SAILOR, DO 100 mL/hr at 10/20/23 0434 New Bag at 10/20/23 0434   melatonin tablet 6 mg  6 mg Oral QHS PRN Shona Terry SAILOR, DO       polyethylene glycol (MIRALAX  / GLYCOLAX ) packet 17 g  17 g Oral Daily PRN Shona Terry N, DO       prochlorperazine  (COMPAZINE ) injection 5 mg  5 mg Intravenous Q6H PRN Shona Terry SAILOR, DO            Lab Results Last 48 Hours        Results for orders placed or performed during the hospital encounter of 10/19/23 (from the past 48 hours)  Basic metabolic panel     Status: Abnormal    Collection Time: 10/19/23  9:10 PM  Result Value Ref Range    Sodium 132 (L) 135 - 145  mmol/L    Potassium 3.6 3.5 - 5.1 mmol/L    Chloride 100 98 - 111 mmol/L    CO2 22 22 - 32 mmol/L    Glucose, Bld 95 70 - 99 mg/dL      Comment: Glucose reference range applies only to samples taken after fasting for at least 8 hours.    BUN 15 6 - 20 mg/dL    Creatinine, Ser 8.95 (H) 0.44 - 1.00 mg/dL    Calcium 8.3 (L) 8.9 - 10.3 mg/dL    GFR, Estimated >39 >39 mL/min  Comment: (NOTE) Calculated using the CKD-EPI Creatinine Equation (2021)      Anion gap 10 5 - 15      Comment: Performed at [facility], [address], [place], [place] [address]  CBC     Status: Abnormal    Collection Time: 10/19/23  9:10 PM  Result Value Ref Range    WBC 10.5 4.0 - 10.5 K/uL    RBC 3.75 (L) 3.87 - 5.11 MIL/uL    Hemoglobin 11.8 (L) 12.0 - 15.0 g/dL    HCT 65.6 (L) 63.9 - 46.0 %    MCV 91.5 80.0 - 100.0 fL    MCH 31.5 26.0 - 34.0 pg    MCHC 34.4 30.0 - 36.0 g/dL    RDW 86.8 88.4 - 84.4 %    Platelets 181 150 - 400 K/uL    nRBC 0.0 0.0 - 0.2 %      Comment: Performed at [facility] [address], [place], [place] [address]  Troponin I (High Sensitivity)     Status: None    Collection Time: 10/19/23  9:10 PM  Result Value Ref Range    Troponin I (High Sensitivity) <2 <18 ng/L      Comment: (NOTE) Elevated high sensitivity troponin I (hsTnI) values and significant  changes across serial measurements may suggest ACS but many other  chronic and acute conditions are known to elevate hsTnI results.  Refer to the Links section for chest pain algorithms and additional  guidance. Performed at [facility], [address], [place], [place] [address]    hCG, quantitative, pregnancy     Status: None    Collection Time: 10/19/23  9:10 PM  Result Value Ref Range    hCG, Beta Chain, Quant, S <1 <5 mIU/mL      Comment:          GEST. AGE      CONC.  (mIU/mL)   <=1 WEEK        5 - 50     2 WEEKS       50 - 500     3 WEEKS       100 - 10,000     4 WEEKS     1,000 - 30,000     5 WEEKS      3,500 - 115,000   6-8 WEEKS     12,000 - 270,000    12 WEEKS     15,000 - 220,000        FEMALE AND NON-PREGNANT FEMALE:     LESS THAN 5 mIU/mL Performed at [facility], [address], [place], [place] [address]    Lipase, blood     Status: None    Collection Time: 10/19/23  9:10 PM  Result Value Ref Range    Lipase 35 11 - 51 U/L      Comment: Performed at [facility], [address] [place], [place] [address]  Hepatic function panel     Status: Abnormal    Collection Time: 10/19/23  9:10 PM  Result Value Ref Range    Total Protein 6.3 (L) 6.5 - 8.1 g/dL    Albumin 3.5 3.5 - 5.0 g/dL    AST 870 (H) 15 - 41 U/L    ALT 76 (H) 0 - 44 U/L    Alkaline Phosphatase 70 38 - 126 U/L    Total Bilirubin 0.8 0.0 - 1.2 mg/dL    Bilirubin, Direct 0.2 0.0 - 0.2 mg/dL  Indirect Bilirubin 0.6 0.3 - 0.9 mg/dL      Comment: Performed at [facility], [address] [place], [place] [address]  Urinalysis, Routine w reflex microscopic -Urine, Clean Catch     Status: Abnormal    Collection Time: 10/19/23  9:58 PM  Result Value Ref Range    Color, Urine STRAW (A) YELLOW    APPearance CLEAR CLEAR    Specific Gravity, Urine 1.005 1.005 - 1.030    pH 8.0 5.0 - 8.0    Glucose, UA NEGATIVE NEGATIVE mg/dL    Hgb urine dipstick NEGATIVE NEGATIVE    Bilirubin Urine NEGATIVE NEGATIVE    Ketones, ur NEGATIVE NEGATIVE mg/dL    Protein, ur NEGATIVE NEGATIVE mg/dL    Nitrite NEGATIVE NEGATIVE    Leukocytes,Ua NEGATIVE NEGATIVE      Comment: Performed at [facility], [address], [place], [place] [address]  POC urine preg, ED     Status: None    Collection Time: 10/19/23 10:02 PM  Result Value Ref Range    Preg Test, Ur NEGATIVE NEGATIVE      Comment:        THE SENSITIVITY OF THIS METHODOLOGY IS >20 mIU/mL.    Lipid panel     Status: None    Collection Time: 10/20/23  3:48 AM  Result Value Ref Range    Cholesterol 112 0 - 200 mg/dL    Triglycerides 20 <849 mg/dL    HDL 54 >59 mg/dL    Total  CHOL/HDL Ratio 2.1 RATIO    VLDL 4 0 - 40 mg/dL    LDL Cholesterol 54 0 - 99 mg/dL      Comment:        Total Cholesterol/HDL:CHD Risk Coronary Heart Disease Risk Table                     Men   Women  1/2 Average Risk   3.4   3.3  Average Risk       5.0   4.4  2 X Average Risk   9.6   7.1  3 X Average Risk  23.4   11.0        Use the calculated Patient Ratio above and the CHD Risk Table to determine the patient's CHD Risk.        ATP III CLASSIFICATION (LDL):  <100     mg/dL   Optimal  899-870  mg/dL   Near or Above                    Optimal  130-159  mg/dL   Borderline  839-810  mg/dL   High  >809     mg/dL   Very High Performed at [facility], [address] [place], [place] [address]    CBC with Differential/Platelet     Status: Abnormal    Collection Time: 10/20/23  3:48 AM  Result Value Ref Range    WBC 4.6 4.0 - 10.5 K/uL    RBC 3.67 (L) 3.87 - 5.11 MIL/uL    Hemoglobin 11.4 (L) 12.0 - 15.0 g/dL    HCT 65.5 (L) 63.9 - 46.0 %    MCV 93.7 80.0 - 100.0 fL    MCH 31.1 26.0 - 34.0 pg    MCHC 33.1 30.0 - 36.0 g/dL    RDW 86.9 88.4 - 84.4 %    Platelets 166 150 - 400 K/uL    nRBC 0.0 0.0 - 0.2 %  Neutrophils Relative % 69 %    Neutro Abs 3.2 1.7 - 7.7 K/uL    Lymphocytes Relative 19 %    Lymphs Abs 0.9 0.7 - 4.0 K/uL    Monocytes Relative 10 %    Monocytes Absolute 0.5 0.1 - 1.0 K/uL    Eosinophils Relative 2 %    Eosinophils Absolute 0.1 0.0 - 0.5 K/uL    Basophils Relative 0 %    Basophils Absolute 0.0 0.0 - 0.1 K/uL    Immature Granulocytes 0 %    Abs Immature Granulocytes 0.01 0.00 - 0.07 K/uL      Comment: Performed at [facility], [address] [place], [place] [address]  Comprehensive metabolic panel     Status: Abnormal    Collection Time: 10/20/23  3:48 AM  Result Value Ref Range    Sodium 136 135 - 145 mmol/L    Potassium 3.7 3.5 - 5.1 mmol/L    Chloride 106 98 - 111 mmol/L    CO2 23 22 - 32 mmol/L    Glucose, Bld 100 (H) 70 - 99 mg/dL       Comment: Glucose reference range applies only to samples taken after fasting for at least 8 hours.    BUN 12 6 - 20 mg/dL    Creatinine, Ser 9.12 0.44 - 1.00 mg/dL    Calcium 8.1 (L) 8.9 - 10.3 mg/dL    Total Protein 5.9 (L) 6.5 - 8.1 g/dL    Albumin 3.3 (L) 3.5 - 5.0 g/dL    AST 219 (H) 15 - 41 U/L    ALT 710 (H) 0 - 44 U/L    Alkaline Phosphatase 78 38 - 126 U/L    Total Bilirubin 1.0 0.0 - 1.2 mg/dL    GFR, Estimated >39 >39 mL/min      Comment: (NOTE) Calculated using the CKD-EPI Creatinine Equation (2021)      Anion gap 7 5 - 15      Comment: Performed at [facility], [address], [place], [place] [address]  Magnesium     Status: None    Collection Time: 10/20/23  3:48 AM  Result Value Ref Range    Magnesium 1.9 1.7 - 2.4 mg/dL      Comment: Performed at [facility], [address], [place], [place] [address]  Phosphorus     Status: None    Collection Time: 10/20/23  3:48 AM  Result Value Ref Range    Phosphorus 4.1 2.5 - 4.6 mg/dL      Comment: Performed at [facility], [address] [place], [place] [address]         Imaging Results (Last 48 hours)  CT ABDOMEN PELVIS W CONTRAST Result Date: 10/19/2023 CLINICAL DATA:  Epigastric pain L side chest pain that radiates to her back that started about 1 hour ago. Also reports nausea. Last meal was around 1800 and reports it was stew that was a little spicy, but nothing abnormal that she has not eaten before. EXAM: CT ABDOMEN AND PELVIS WITH CONTRAST TECHNIQUE: Multidetector CT imaging of the abdomen and pelvis was performed using the standard protocol following bolus administration of intravenous contrast. RADIATION DOSE REDUCTION: This exam was performed according to the departmental dose-optimization program which includes automated exposure control, adjustment of the mA and/or kV according to patient size and/or use of iterative reconstruction technique. CONTRAST:  100mL OMNIPAQUE  IOHEXOL  300 MG/ML  SOLN COMPARISON:  None  Available. FINDINGS: Lower chest: No acute abnormality. Hepatobiliary: The  liver is enlarged measuring up to 20 cm. No focal liver abnormality. Question cholelithiasis (4:35). Gallbladder wall thickening and pericholecystic fluid. Question portal edema. No biliary dilatation. Pancreas: No focal lesion. Normal pancreatic contour. No surrounding inflammatory changes. No main pancreatic ductal dilatation. Spleen: Normal in size without focal abnormality. Adrenals/Urinary Tract: No adrenal nodule bilaterally. Bilateral kidneys enhance symmetrically. No hydronephrosis. No hydroureter. The urinary bladder is unremarkable. Stomach/Bowel: Stomach is within normal limits. No evidence of bowel wall thickening or dilatation. Stool throughout the colon. Appendix appears normal. Vascular/Lymphatic: The main portal, splenic, superior mesenteric vein are patent. No abdominal aorta or iliac aneurysm. No abdominal, pelvic, or inguinal lymphadenopathy. Reproductive: Uterus and bilateral adnexa are unremarkable. Tampon within the vagina. Other: No intraperitoneal free fluid. No intraperitoneal free gas. No organized fluid collection. Musculoskeletal: No abdominal wall hernia or abnormality. No suspicious lytic or blastic osseous lesions. No acute displaced fracture. IMPRESSION: 1. Gallbladder wall thickening and pericholecystic fluid. Question cholelithiasis. Recommend right upper quadrant ultrasound for further evaluation. 2. Stool throughout the colon.  Correlate for constipation. 3. Hepatomegaly. Electronically Signed   By: Morgane  Naveau M.D.   On: 10/19/2023 23:07       ROS:  Pertinent items are noted in HPI.   Blood pressure (!) 102/58, pulse (!) 51, temperature 98.5 F (36.9 C), temperature source Oral, resp. rate 16, height 5' 8 (1.727 m), weight 59 kg, last menstrual period 10/19/2023, SpO2 97%, unknown if currently breastfeeding. Physical Exam:  Physical Exam Constitutional:      General: She is not in acute  distress.    Appearance: Normal appearance. She is normal weight.  HENT:     Head: Normocephalic and atraumatic.     Right Ear: External ear normal.     Left Ear: External ear normal.     Mouth/Throat:     Mouth: Mucous membranes are moist.     Pharynx: Oropharynx is clear. No oropharyngeal exudate.  Eyes:     General: No scleral icterus.    Extraocular Movements: Extraocular movements intact.     Pupils: Pupils are equal, round, and reactive to light.  Cardiovascular:     Rate and Rhythm: Normal rate and regular rhythm.     Pulses: Normal pulses.     Heart sounds: No murmur heard.    No friction rub. No gallop.  Pulmonary:     Effort: Pulmonary effort is normal. No respiratory distress.     Breath sounds: Normal breath sounds.  Abdominal:     General: Abdomen is flat. There is no distension.     Palpations: Abdomen is soft.     Tenderness: There is no abdominal tenderness. There is no guarding or rebound.  Skin:    General: Skin is warm and dry.  Neurological:     General: No focal deficit present.     Mental Status: She is alert.     Assessment/Plan: Impression: Acute Cholecystitis/Possible Hepatitis   Assessment: Patients Imaging so far has been suggestive of cholecystitis and she does have a family history of this. She does not necessarily fit the classical clinical picture and her description of pain is also consistent with pancreatitis. With her liver enlarged and elevated AST and ALT without Alk phos or bilirubin elevation could also be hepatitis-related.   Plan: HIDA Scan scheduled for today to guide management. If determined to be cholecystitis, can proceed with robotic-assisted laparoscopic cholecystectomy. If negative, consider GI referral for further workup.    Leonor JAYSON Clause 10/20/2023, 9:05 AM

## 2023-10-27 NOTE — Telephone Encounter (Signed)
 [facility]  Called patient to discuss labwork. Labwork from GI panel for hepatitis and immune hepatitis all negative. The best hypothesis is that she passed a stone, versus sandy gravel stone. Discussed that she is at risk for cholangitis/ pancreatitis. She is nervous about getting surgery but wants to proceed. We have already discussed the risk/ benefits of surgery.   She wants to move surgery to 8/11.  Will get her labwork from [facility].   [name], MD [facility] [address] [place], [place] [address] [phone] (office)

## 2023-10-28 ENCOUNTER — Ambulatory Visit: Admitting: General Surgery

## 2023-10-30 ENCOUNTER — Other Ambulatory Visit (HOSPITAL_COMMUNITY)

## 2023-11-05 NOTE — Patient Instructions (Signed)
 Sheila Little  11/05/2023     @PREFPERIOPPHARMACY @   Your procedure is scheduled on  11/10/2023.   Report to [facility] at  [phone] A.M.   Call this number if you have problems the morning of surgery:  [phone]  If you experience any cold or flu symptoms such as cough, fever, chills, shortness of breath, etc. between now and your scheduled surgery, please notify us  at the above number.   Remember:  Do not eat after midnight.   You may drink clear liquids until  0610 am on 11/10/2023.    Clear liquids allowed are:                    Water , Juice (No red color; non-citric and without pulp; diabetics please choose diet or no sugar options), Carbonated beverages (diabetics please choose diet or no sugar options), Clear Tea (No creamer, milk, or cream, including half & half and powdered creamer), Black Coffee Only (No creamer, milk or cream, including half & half and powdered creamer), and Clear Sports drink (No red color; diabetics please choose diet or no sugar options)    Take these medicines the morning of surgery with A SIP OF WATER                                                        None.    Do not wear jewelry, make-up or nail polish, including gel polish,  artificial nails, or any other type of covering on natural nails (fingers and  toes).  Do not wear lotions, powders, or perfumes, or deodorant.  Do not shave 48 hours prior to surgery.  Men may shave face and neck.  Do not bring valuables to the hospital.  [facility] is not responsible for any belongings or valuables.  Contacts, dentures or bridgework may not be worn into surgery.  Leave your suitcase in the car.  After surgery it may be brought to your room.  For patients admitted to the hospital, discharge time will be determined by your treatment team.  Patients discharged the day of surgery will not be allowed to drive home and must have someone with them for 24 hours.    Special instructions:    DO NOT smoke tobacco or vape for 24 hours before your procedure.  Please read over the following fact sheets that you were given. Coughing and Deep Breathing, Surgical Site Infection Prevention, Anesthesia Post-op Instructions, and Care and Recovery After Surgery      Surgery to Biopsy the Liver: What to Know After After surgery to biopsy your liver, it's common to have pain, soreness, and bruising where the surgery was done. You may also feel tired for a few days. Follow these instructions at home: Medicines Take your medicines only as told. If you were given antibiotics, take them as told. Do not stop taking them even if you start to feel better. If you're taking blood thinners: Take your medicines as told. Take them at the same time each day. Talk with your health care provider before taking any products you can buy at the store. Do not do things that could hurt or bruise you. Be careful to avoid falls. Wear an alert bracelet or carry a card that says you take  blood thinners. You may need to take steps to help treat or prevent trouble pooping (constipation), such as: Taking medicines to help you poop. Eating foods high in fiber, like beans, whole grains, and fresh fruits and vegetables. Drinking more fluids as told. Ask your provider if it's safe to drive or use machines while taking your medicine. Caring for your cuts from surgery  Do not take baths, swim, or use a hot tub until you're told it's OK. Ask if you can shower. Take care of your cuts from surgery as told. Make sure you: Wash your hands with soap and water  for at least 20 seconds before and after you change your bandage. If you can't use soap and water , use hand sanitizer. Change your bandage. Leave stitches or skin glue alone. Leave tape strips alone unless you're told to take them off. You may trim the edges of the tape strips if they curl up. Check the area around your cuts every day for signs of infection. Check  for: More redness, swelling, or pain. More fluid or blood. Warmth. Pus or a bad smell. Activity Rest as told. Get up to take short walks at least every 2 hours during the day. This helps you breathe better and keeps your blood flowing. Ask for help if you feel weak or unsteady. Ask if it's OK for you to lift. Ask what things are safe for you to do at home. Ask when you can go back to work or school. General instructions Drink more fluids as told. Do not smoke, vape, or use nicotine or tobacco. These can slow down healing. Do not drink alcohol in the first week after surgery. Your provider may give you more instructions. Make sure you know what you can and can't do. Contact a health care provider if: You have more bleeding from a cut. You have any signs of infection in a cut. You have a new rash. You have a fever or chills. You have swelling, bloating, or pain in your belly. You feel like you may throw up or you do throw up. If you can't reach your provider, go to an urgent care or emergency room. Get help right away if: You feel dizzy or you faint. You have shortness of breath or trouble breathing. You have chest pain. You have problems with your speech or vision. You have trouble with your balance or moving your arms or legs. These symptoms may be an emergency. Call 911 right away. Do not wait to see if the symptoms will go away. Do not drive yourself to the hospital. This information is not intended to replace advice given to you by your health care provider. Make sure you discuss any questions you have with your health care provider. Document Revised: 10/02/2022 Document Reviewed: 10/02/2022 Elsevier Patient Education  2024 [employer] Minimally Invasive Cholecystectomy, Care After The following information offers guidance on how to care for yourself after your procedure. Your health care provider may also give you more specific instructions. If you have problems or  questions, contact your health care provider. What can I expect after the procedure? After the procedure, it is common to have: Pain at your incision sites. You will be given medicines to control this pain. Mild nausea or vomiting. Bloating and possible shoulder pain from the gas that was used during the procedure. Follow these instructions at home: Medicines Take over-the-counter and prescription medicines only as told by your health care provider. If you were prescribed an antibiotic medicine, take  it as told by your health care provider. Do not stop using the antibiotic even if you start to feel better. Ask your health care provider if the medicine prescribed to you: Requires you to avoid driving or using machinery. Can cause constipation. You may need to take these actions to prevent or treat constipation: Drink enough fluid to keep your urine pale yellow. Take over-the-counter or prescription medicines. Eat foods that are high in fiber, such as beans, whole grains, and fresh fruits and vegetables. Limit foods that are high in fat and processed sugars, such as fried or sweet foods. Incision care  Follow instructions from your health care provider about how to take care of your incisions. Make sure you: Wash your hands with soap and water  for at least 20 seconds before and after you change your bandage (dressing). If soap and water  are not available, use hand sanitizer. Change your dressing as told by your health care provider. Leave stitches (sutures), skin glue, or adhesive strips in place. These skin closures may need to be in place for 2 weeks or longer. If adhesive strip edges start to loosen and curl up, you may trim the loose edges. Do not remove adhesive strips completely unless your health care provider tells you to do that. Do not take baths, swim, or use a hot tub until your health care provider approves. Ask your health care provider if you may take showers. You may only be  allowed to take sponge baths. Check your incision area every day for signs of infection. Check for: More redness, swelling, or pain. Fluid or blood. Warmth. Pus or a bad smell. Activity Rest as told by your health care provider. Do not do activities that require a lot of effort. Avoid sitting for a long time without moving. Get up to take short walks every 1-2 hours. This is important to improve blood flow and breathing. Ask for help if you feel weak or unsteady. Do not lift anything that is heavier than 10 lb (4.5 kg), or the limit that you are told, until your health care provider says that it is safe. Do not play contact sports until your health care provider approves. Do not return to work or school until your health care provider approves. Return to your normal activities as told by your health care provider. Ask your health care provider what activities are safe for you. General instructions If you were given a sedative during the procedure, it can affect you for several hours. Do not drive or operate machinery until your health care provider says that it is safe. Keep all follow-up visits. This is important. Contact a health care provider if: You develop a rash. You have more redness, swelling, or pain around your incisions. You have fluid or blood coming from your incisions. Your incisions feel warm to the touch. You have pus or a bad smell coming from your incisions. You have a fever. One or more of your incisions breaks open. Get help right away if: You have trouble breathing. You have chest pain. You have more pain in your shoulders. You faint or feel dizzy when you stand. You have severe pain in your abdomen. You have nausea or vomiting that lasts for more than one day. You have leg pain that is new or unusual, or if it is localized to one specific spot. These symptoms may represent a serious problem that is an emergency. Do not wait to see if the symptoms will go away.  Get  medical help right away. Call your local emergency services (911 in the [place] Do not drive yourself to the hospital. Summary After your procedure, it is common to have pain at the incision sites. You may also have nausea or bloating. Follow your health care provider's instructions about medicine, activity restrictions, and caring for your incision areas. Do not do activities that require a lot of effort. Contact a health care provider if you have a fever or other signs of infection, such as more redness, swelling, or pain around the incisions. Get help right away if you have chest pain, increasing pain in the shoulders, or trouble breathing. This information is not intended to replace advice given to you by your health care provider. Make sure you discuss any questions you have with your health care provider. Document Revised: 09/18/2020 Document Reviewed: 09/19/2020 Elsevier Patient Education  2024 Elsevier Inc.General Anesthesia, Adult, Care After The following information offers guidance on how to care for yourself after your procedure. Your health care provider may also give you more specific instructions. If you have problems or questions, contact your health care provider. What can I expect after the procedure? After the procedure, it is common for people to: Have pain or discomfort at the IV site. Have nausea or vomiting. Have a sore throat or hoarseness. Have trouble concentrating. Feel cold or chills. Feel weak, sleepy, or tired (fatigue). Have soreness and body aches. These can affect parts of the body that were not involved in surgery. Follow these instructions at home: For the time period you were told by your health care provider:  Rest. Do not participate in activities where you could fall or become injured. Do not drive or use machinery. Do not drink alcohol. Do not take sleeping pills or medicines that cause drowsiness. Do not make important decisions or sign legal  documents. Do not take care of children on your own. General instructions Drink enough fluid to keep your urine pale yellow. If you have sleep apnea, surgery and certain medicines can increase your risk for breathing problems. Follow instructions from your health care provider about wearing your sleep device: Anytime you are sleeping, including during daytime naps. While taking prescription pain medicines, sleeping medicines, or medicines that make you drowsy. Return to your normal activities as told by your health care provider. Ask your health care provider what activities are safe for you. Take over-the-counter and prescription medicines only as told by your health care provider. Do not use any products that contain nicotine or tobacco. These products include cigarettes, chewing tobacco, and vaping devices, such as e-cigarettes. These can delay incision healing after surgery. If you need help quitting, ask your health care provider. Contact a health care provider if: You have nausea or vomiting that does not get better with medicine. You vomit every time you eat or drink. You have pain that does not get better with medicine. You cannot urinate or have bloody urine. You develop a skin rash. You have a fever. Get help right away if: You have trouble breathing. You have chest pain. You vomit blood. These symptoms may be an emergency. Get help right away. Call 911. Do not wait to see if the symptoms will go away. Do not drive yourself to the hospital. Summary After the procedure, it is common to have a sore throat, hoarseness, nausea, vomiting, or to feel weak, sleepy, or fatigue. For the time period you were told by your health care provider, do not drive or use machinery. Get  help right away if you have difficulty breathing, have chest pain, or vomit blood. These symptoms may be an emergency. This information is not intended to replace advice given to you by your health care provider.  Make sure you discuss any questions you have with your health care provider. Document Revised: 06/15/2021 Document Reviewed: 06/15/2021 Elsevier Patient Education  2024 Else[employer] Inc.How to Use Chlorhexidine at Home in the Shower Chlorhexidine gluconate (CHG) is a germ-killing (antiseptic) wash that's used to clean the skin. It can get rid of the germs that normally live on the skin and can keep them away for about 24 hours. If you're having surgery, you may be told to shower with CHG at home the night before surgery. This can help lower your risk for infection. To use CHG wash in the shower, follow the steps below. Supplies needed: CHG body wash. Clean washcloth. Clean towel. How to use CHG in the shower Follow these steps unless you're told to use CHG in a different way: Start the shower. Use your normal soap and shampoo to wash your face and hair. Turn off the shower or move out of the shower stream. Pour CHG onto a clean washcloth. Do not use any type of brush or rough sponge. Start at your neck, washing your body down to your toes. Make sure you: Wash the part of your body where the surgery will be done for at least 1 minute. Do not scrub. Do not use CHG on your head or face unless your health care provider tells you to. If it gets into your ears or eyes, rinse them well with water . Do not wash your genitals with CHG. Wash your back and under your arms. Make sure to wash skin folds. Let the CHG sit on your skin for 1-2 minutes or as long as told. Rinse your entire body in the shower, including all body creases and folds. Turn off the shower. Dry off with a clean towel. Do not put anything on your skin afterward, such as powder, lotion, or perfume. Put on clean clothes or pajamas. If it's the night before surgery, sleep in clean sheets. General tips Use CHG only as told, and follow the instructions on the label. Use the full amount of CHG as told. This is often one bottle. Do not  smoke and stay away from flames after using CHG. Your skin may feel sticky after using CHG. This is normal. The sticky feeling will go away as the CHG dries. Do not use CHG: If you have a chlorhexidine allergy or have reacted to chlorhexidine in the past. On open wounds or areas of skin that have broken skin, cuts, or scrapes. On babies younger than 48 months of age. Contact a health care provider if: You have questions about using CHG. Your skin gets irritated or itchy. You have a rash after using CHG. You swallow any CHG. Call your local poison control center [phone] in the [place] Your eyes itch badly, or they become very red or swollen. Your hearing changes. You have trouble seeing. If you can't reach your provider, go to an urgent care or emergency room. Do not drive yourself. Get help right away if: You have swelling or tingling in your mouth or throat. You make high-pitched whistling sounds when you breathe, most often when you breathe out (wheeze). You have trouble breathing. These symptoms may be an emergency. Call 911 right away. Do not wait to see if the symptoms will go away. Do not drive  yourself to the hospital. This information is not intended to replace advice given to you by your health care provider. Make sure you discuss any questions you have with your health care provider. Document Revised: 10/01/2022 Document Reviewed: 09/27/2021 Elsevier Patient Education  2024 [employer].

## 2023-11-06 ENCOUNTER — Encounter (HOSPITAL_COMMUNITY): Payer: Self-pay

## 2023-11-06 ENCOUNTER — Other Ambulatory Visit: Payer: Self-pay

## 2023-11-06 ENCOUNTER — Encounter (HOSPITAL_COMMUNITY)
Admission: RE | Admit: 2023-11-06 | Discharge: 2023-11-06 | Disposition: A | Discharge status: Home / Self Care | Source: Ambulatory Visit | Attending: General Surgery | Admitting: General Surgery

## 2023-11-06 VITALS — BP 112/71 | HR 92 | Resp 18 | Ht 68.0 in | Wt 130.0 lb

## 2023-11-06 DIAGNOSIS — Z01818 Encounter for other preprocedural examination: Secondary | ICD-10-CM

## 2023-11-06 DIAGNOSIS — Z01812 Encounter for preprocedural laboratory examination: Secondary | ICD-10-CM | POA: Diagnosis present

## 2023-11-06 DIAGNOSIS — K8050 Calculus of bile duct without cholangitis or cholecystitis without obstruction: Secondary | ICD-10-CM | POA: Diagnosis not present

## 2023-11-06 HISTORY — DX: Anxiety disorder, unspecified: F41.9

## 2023-11-06 LAB — CBC WITH DIFFERENTIAL/PLATELET
Abs Immature Granulocytes: 0.03 K/uL (ref 0.00–0.07)
Basophils Absolute: 0 K/uL (ref 0.0–0.1)
Basophils Relative: 0 %
Eosinophils Absolute: 0.1 K/uL (ref 0.0–0.5)
Eosinophils Relative: 2 %
HCT: 37.4 % (ref 36.0–46.0)
Hemoglobin: 12.3 g/dL (ref 12.0–15.0)
Immature Granulocytes: 0 %
Lymphocytes Relative: 23 %
Lymphs Abs: 1.6 K/uL (ref 0.7–4.0)
MCH: 30.9 pg (ref 26.0–34.0)
MCHC: 32.9 g/dL (ref 30.0–36.0)
MCV: 94 fL (ref 80.0–100.0)
Monocytes Absolute: 0.5 K/uL (ref 0.1–1.0)
Monocytes Relative: 8 %
Neutro Abs: 4.7 K/uL (ref 1.7–7.7)
Neutrophils Relative %: 67 %
Platelets: 196 K/uL (ref 150–400)
RBC: 3.98 MIL/uL (ref 3.87–5.11)
RDW: 13.2 % (ref 11.5–15.5)
WBC: 7 K/uL (ref 4.0–10.5)
nRBC: 0 % (ref 0.0–0.2)

## 2023-11-06 LAB — COMPREHENSIVE METABOLIC PANEL WITH GFR
ALT: 23 U/L (ref 0–44)
AST: 17 U/L (ref 15–41)
Albumin: 3.9 g/dL (ref 3.5–5.0)
Alkaline Phosphatase: 54 U/L (ref 38–126)
Anion gap: 9 (ref 5–15)
BUN: 12 mg/dL (ref 6–20)
CO2: 24 mmol/L (ref 22–32)
Calcium: 8.7 mg/dL — ABNORMAL LOW (ref 8.9–10.3)
Chloride: 105 mmol/L (ref 98–111)
Creatinine, Ser: 0.76 mg/dL (ref 0.44–1.00)
GFR, Estimated: >60 mL/min (ref >60)
Glucose, Bld: 92 mg/dL (ref 70–99)
Potassium: 3.8 mmol/L (ref 3.5–5.1)
Sodium: 138 mmol/L (ref 135–145)
Total Bilirubin: 0.7 mg/dL (ref 0.0–1.2)
Total Protein: 6.6 g/dL (ref 6.5–8.1)

## 2023-11-06 LAB — POCT PREGNANCY, URINE: Preg Test, Ur: NEGATIVE

## 2023-11-10 ENCOUNTER — Encounter (HOSPITAL_COMMUNITY)
Admission: RE | Disposition: A | Discharge status: Home / Self Care | Payer: Self-pay | Source: Home / Self Care | Attending: General Surgery

## 2023-11-10 ENCOUNTER — Other Ambulatory Visit: Payer: Self-pay

## 2023-11-10 ENCOUNTER — Ambulatory Visit (HOSPITAL_COMMUNITY)
Admission: RE | Admit: 2023-11-10 | Discharge: 2023-11-10 | Disposition: A | Discharge status: Home / Self Care | Attending: General Surgery | Admitting: General Surgery

## 2023-11-10 ENCOUNTER — Encounter (HOSPITAL_COMMUNITY): Payer: Self-pay | Admitting: General Surgery

## 2023-11-10 ENCOUNTER — Ambulatory Visit (HOSPITAL_COMMUNITY): Admitting: Anesthesiology

## 2023-11-10 ENCOUNTER — Ambulatory Visit (HOSPITAL_BASED_OUTPATIENT_CLINIC_OR_DEPARTMENT_OTHER): Admitting: Anesthesiology

## 2023-11-10 DIAGNOSIS — K8050 Calculus of bile duct without cholangitis or cholecystitis without obstruction: Secondary | ICD-10-CM | POA: Diagnosis not present

## 2023-11-10 DIAGNOSIS — R7401 Elevation of levels of liver transaminase levels: Secondary | ICD-10-CM

## 2023-11-10 DIAGNOSIS — K8044 Calculus of bile duct with chronic cholecystitis without obstruction: Secondary | ICD-10-CM | POA: Diagnosis not present

## 2023-11-10 DIAGNOSIS — G8929 Other chronic pain: Secondary | ICD-10-CM | POA: Diagnosis present

## 2023-11-10 DIAGNOSIS — K828 Other specified diseases of gallbladder: Secondary | ICD-10-CM | POA: Diagnosis not present

## 2023-11-10 DIAGNOSIS — R160 Hepatomegaly, not elsewhere classified: Secondary | ICD-10-CM

## 2023-11-10 DIAGNOSIS — R109 Unspecified abdominal pain: Secondary | ICD-10-CM | POA: Diagnosis present

## 2023-11-10 HISTORY — PX: LIVER BIOPSY: SHX301

## 2023-11-10 SURGERY — CHOLECYSTECTOMY, ROBOT-ASSISTED, LAPAROSCOPIC
Anesthesia: General | Site: Abdomen

## 2023-11-10 MED ORDER — DEXMEDETOMIDINE HCL IN NACL 80 MCG/20ML IV SOLN
INTRAVENOUS | Status: DC | PRN
  Administered 2023-11-10 (×2): 12 ug via INTRAVENOUS

## 2023-11-10 MED ORDER — CHLORHEXIDINE GLUCONATE CLOTH 2 % EX PADS: 6.0000 | MEDICATED_PAD | Freq: Once | CUTANEOUS | Status: DC

## 2023-11-10 MED ORDER — HYDROMORPHONE HCL 1 MG/ML IJ SOLN
INTRAMUSCULAR | Status: AC
Start: 2023-11-10 — End: 2023-11-10
  Filled 2023-11-10: qty 0.5

## 2023-11-10 MED ORDER — SUGAMMADEX SODIUM 200 MG/2ML IV SOLN
INTRAVENOUS | Status: AC
  Filled 2023-11-10: qty 2

## 2023-11-10 MED ORDER — ONDANSETRON HCL 4 MG/2ML IJ SOLN
INTRAMUSCULAR | Status: AC
  Filled 2023-11-10: qty 2

## 2023-11-10 MED ORDER — PHENYLEPHRINE 80 MCG/ML (10ML) SYRINGE FOR IV PUSH (FOR BLOOD PRESSURE SUPPORT)
PREFILLED_SYRINGE | INTRAVENOUS | Status: DC | PRN
  Administered 2023-11-10 (×2): 80 ug via INTRAVENOUS

## 2023-11-10 MED ORDER — HEMOSTATIC AGENTS (NO CHARGE) OPTIME
TOPICAL | Status: DC | PRN
Start: 2023-11-10 — End: 2023-11-10
  Administered 2023-11-10 (×2): 1

## 2023-11-10 MED ORDER — ACETAMINOPHEN 500 MG PO TABS
1000.0000 mg | ORAL_TABLET | Freq: Once | ORAL | Status: AC
  Administered 2023-11-10 (×2): 1000 mg via ORAL
  Filled 2023-11-10: qty 2

## 2023-11-10 MED ORDER — LIDOCAINE 2% (20 MG/ML) 5 ML SYRINGE
INTRAMUSCULAR | Status: DC | PRN
  Administered 2023-11-10 (×2): 60 mg via INTRAVENOUS

## 2023-11-10 MED ORDER — ACETAMINOPHEN 160 MG/5ML PO SOLN
960.0000 mg | Freq: Once | ORAL | Status: AC
  Filled 2023-11-10: qty 30

## 2023-11-10 MED ORDER — ORAL CARE MOUTH RINSE: 15.0000 mL | Freq: Once | OROMUCOSAL | Status: DC

## 2023-11-10 MED ORDER — BUPIVACAINE HCL (PF) 0.5 % IJ SOLN
INTRAMUSCULAR | Status: DC | PRN
  Administered 2023-11-10 (×2): 25 mL

## 2023-11-10 MED ORDER — FENTANYL CITRATE (PF) 250 MCG/5ML IJ SOLN
INTRAMUSCULAR | Status: AC
  Filled 2023-11-10: qty 5

## 2023-11-10 MED ORDER — LIDOCAINE 2% (20 MG/ML) 5 ML SYRINGE
INTRAMUSCULAR | Status: AC
Start: 2023-11-10 — End: 2023-11-10
  Filled 2023-11-10: qty 5

## 2023-11-10 MED ORDER — LACTATED RINGERS IV SOLN: INTRAVENOUS | Status: DC

## 2023-11-10 MED ORDER — DEXAMETHASONE SODIUM PHOSPHATE 10 MG/ML IJ SOLN
INTRAMUSCULAR | Status: DC | PRN
  Administered 2023-11-10 (×2): 8 mg via INTRAVENOUS

## 2023-11-10 MED ORDER — SODIUM CHLORIDE 0.9 % IV SOLN
2.0000 g | INTRAVENOUS | Status: AC
  Administered 2023-11-10 (×2): 2 g via INTRAVENOUS
  Filled 2023-11-10: qty 2

## 2023-11-10 MED ORDER — STERILE WATER FOR IRRIGATION IR SOLN
Status: DC | PRN
  Administered 2023-11-10 (×2): 500 mL

## 2023-11-10 MED ORDER — CHLORHEXIDINE GLUCONATE 0.12 % MT SOLN: 15.0000 mL | Freq: Once | OROMUCOSAL | Status: DC

## 2023-11-10 MED ORDER — ONDANSETRON HCL 4 MG PO TABS: 4.0000 mg | ORAL_TABLET | Freq: Three times a day (TID) | ORAL | 1 refills | Status: AC | PRN

## 2023-11-10 MED ORDER — SCOPOLAMINE 1 MG/3DAYS TD PT72
1.0000 | MEDICATED_PATCH | Freq: Once | TRANSDERMAL | Status: DC
  Filled 2023-11-10: qty 1

## 2023-11-10 MED ORDER — ROCURONIUM BROMIDE 10 MG/ML (PF) SYRINGE
PREFILLED_SYRINGE | INTRAVENOUS | Status: DC | PRN
Start: 2023-11-10 — End: 2023-11-10
  Administered 2023-11-10 (×2): 60 mg via INTRAVENOUS

## 2023-11-10 MED ORDER — ROCURONIUM BROMIDE 10 MG/ML (PF) SYRINGE
PREFILLED_SYRINGE | INTRAVENOUS | Status: AC
  Filled 2023-11-10: qty 10

## 2023-11-10 MED ORDER — HYDROMORPHONE HCL 1 MG/ML IJ SOLN
INTRAMUSCULAR | Status: DC | PRN
  Administered 2023-11-10 (×2): .5 mg via INTRAVENOUS

## 2023-11-10 MED ORDER — DEXAMETHASONE SODIUM PHOSPHATE 10 MG/ML IJ SOLN
INTRAMUSCULAR | Status: AC
Start: 2023-11-10 — End: 2023-11-10
  Filled 2023-11-10: qty 1

## 2023-11-10 MED ORDER — DEXMEDETOMIDINE HCL IN NACL 80 MCG/20ML IV SOLN
INTRAVENOUS | Status: AC
  Filled 2023-11-10: qty 20

## 2023-11-10 MED ORDER — INDOCYANINE GREEN 25 MG IV SOLR: 2.5000 mg | Freq: Once | INTRAVENOUS | Status: AC

## 2023-11-10 MED ORDER — FENTANYL CITRATE (PF) 250 MCG/5ML IJ SOLN
INTRAMUSCULAR | Status: DC | PRN
  Administered 2023-11-10: 100 ug via INTRAVENOUS
  Administered 2023-11-10: 50 ug via INTRAVENOUS
  Administered 2023-11-10 (×2): 100 ug via INTRAVENOUS
  Administered 2023-11-10: 50 ug via INTRAVENOUS
  Administered 2023-11-10: 100 ug via INTRAVENOUS

## 2023-11-10 MED ORDER — ONDANSETRON HCL 4 MG/2ML IJ SOLN
INTRAMUSCULAR | Status: DC | PRN
  Administered 2023-11-10 (×2): 4 mg via INTRAVENOUS

## 2023-11-10 MED ORDER — HYDROMORPHONE HCL 1 MG/ML IJ SOLN
0.2500 mg | INTRAMUSCULAR | Status: DC | PRN
  Administered 2023-11-10 (×8): 0.5 mg via INTRAVENOUS
  Filled 2023-11-10 (×4): qty 0.5

## 2023-11-10 MED ORDER — KETOROLAC TROMETHAMINE 30 MG/ML IJ SOLN
30.0000 mg | Freq: Once | INTRAMUSCULAR | Status: AC
  Administered 2023-11-10 (×2): 30 mg via INTRAVENOUS

## 2023-11-10 MED ORDER — SUGAMMADEX SODIUM 200 MG/2ML IV SOLN
INTRAVENOUS | Status: DC | PRN
  Administered 2023-11-10: 100 mg via INTRAVENOUS
  Administered 2023-11-10: 200 mg via INTRAVENOUS
  Administered 2023-11-10: 100 mg via INTRAVENOUS
  Administered 2023-11-10: 200 mg via INTRAVENOUS

## 2023-11-10 MED ORDER — OXYCODONE HCL 5 MG PO TABS: 5.0000 mg | ORAL_TABLET | ORAL | 0 refills | Status: AC | PRN

## 2023-11-10 MED ORDER — BUPIVACAINE HCL (PF) 0.5 % IJ SOLN
INTRAMUSCULAR | Status: AC
  Filled 2023-11-10: qty 30

## 2023-11-10 MED ORDER — KETOROLAC TROMETHAMINE 30 MG/ML IJ SOLN
INTRAMUSCULAR | Status: AC
  Filled 2023-11-10: qty 1

## 2023-11-10 MED ORDER — INDOCYANINE GREEN 25 MG IV SOLR
INTRAVENOUS | Status: AC
  Administered 2023-11-10 (×2): 2.5 mg via INTRAVENOUS
  Filled 2023-11-10: qty 10

## 2023-11-10 MED ORDER — OXYCODONE HCL 5 MG PO TABS
5.0000 mg | ORAL_TABLET | Freq: Once | ORAL | Status: AC | PRN
  Administered 2023-11-10 (×2): 5 mg via ORAL
  Filled 2023-11-10: qty 1

## 2023-11-10 MED ORDER — SODIUM CHLORIDE 0.9 % IV SOLN: 12.5000 mg | INTRAVENOUS | Status: DC | PRN

## 2023-11-10 MED ORDER — PROPOFOL 10 MG/ML IV BOLUS
INTRAVENOUS | Status: DC | PRN
  Administered 2023-11-10 (×2): 150 mg via INTRAVENOUS

## 2023-11-10 MED ORDER — OXYCODONE HCL 5 MG/5ML PO SOLN: 5.0000 mg | Freq: Once | ORAL | Status: AC | PRN

## 2023-11-10 SURGICAL SUPPLY — 38 items
BLADE SURG 15 STRL LF DISP TIS (BLADE) ×1 IMPLANT
CAUTERY HOOK MNPLR 1.6 DVNC XI (INSTRUMENTS) ×1 IMPLANT
CHLORAPREP W/TINT 26 (MISCELLANEOUS) ×1 IMPLANT
CLIP LIGATING HEM O LOK PURPLE (MISCELLANEOUS) ×1 IMPLANT
COVER LIGHT HANDLE (MISCELLANEOUS) IMPLANT
DERMABOND ADVANCED .7 DNX12 (GAUZE/BANDAGES/DRESSINGS) ×1 IMPLANT
DRAPE ARM DVNC X/XI (DISPOSABLE) ×4 IMPLANT
DRAPE COLUMN DVNC XI (DISPOSABLE) ×1 IMPLANT
ELECTRODE REM PT RTRN 9FT ADLT (ELECTROSURGICAL) ×1 IMPLANT
FORCEPS BPLR R/ABLATION 8 DVNC (INSTRUMENTS) ×1 IMPLANT
FORCEPS PROGRASP DVNC XI (FORCEP) ×1 IMPLANT
GLOVE BIO SURGEON STRL SZ 6.5 (GLOVE) ×2 IMPLANT
GLOVE BIO SURGEON STRL SZ7 (GLOVE) IMPLANT
GLOVE BIOGEL PI IND STRL 6.5 (GLOVE) ×2 IMPLANT
GLOVE BIOGEL PI IND STRL 7.0 (GLOVE) ×2 IMPLANT
GOWN STRL REUS W/TWL LRG LVL3 (GOWN DISPOSABLE) ×3 IMPLANT
GRASPER SUT TROCAR 14GX15 (MISCELLANEOUS) ×1 IMPLANT
HEMOSTAT SNOW SURGICEL 2X4 (HEMOSTASIS) IMPLANT
KIT TURNOVER KIT A (KITS) ×1 IMPLANT
MANIFOLD NEPTUNE II (INSTRUMENTS) IMPLANT
NDL HYPO 21X1.5 SAFETY (NEEDLE) ×1 IMPLANT
NDL INSUFFLATION 14GA 120MM (NEEDLE) ×1 IMPLANT
NEEDLE HYPO 21X1.5 SAFETY (NEEDLE) ×1 IMPLANT
NEEDLE INSUFFLATION 14GA 120MM (NEEDLE) ×1 IMPLANT
OBTURATOR OPTICALSTD 8 DVNC (TROCAR) ×1 IMPLANT
PACK LAP CHOLE LZT030E (CUSTOM PROCEDURE TRAY) ×1 IMPLANT
PAD ARMBOARD POSITIONER FOAM (MISCELLANEOUS) ×1 IMPLANT
PENCIL HANDSWITCHING (ELECTRODE) ×1 IMPLANT
POSITIONER HEAD 8X9X4 ADT (SOFTGOODS) ×1 IMPLANT
SEAL UNIV 5-12 XI (MISCELLANEOUS) ×4 IMPLANT
SET BASIN LINEN APH (SET/KITS/TRAYS/PACK) ×1 IMPLANT
SET TUBE SMOKE EVAC HIGH FLOW (TUBING) ×1 IMPLANT
SUT MNCRL AB 4-0 PS2 18 (SUTURE) ×2 IMPLANT
SUT VICRYL 0 UR6 27IN ABS (SUTURE) ×1 IMPLANT
SYR 30ML LL (SYRINGE) ×1 IMPLANT
SYSTEM RETRIEVL 5MM INZII UNIV (BASKET) ×1 IMPLANT
TUBE CONNECTING 12X1/4 (SUCTIONS) IMPLANT
WATER STERILE IRR 500ML POUR (IV SOLUTION) ×1 IMPLANT

## 2023-11-10 NOTE — Progress Notes (Signed)
 [facility]  Still with some pain, gave toradol  now to see if that helps.  Dorann Pander, MD

## 2023-11-10 NOTE — Anesthesia Preprocedure Evaluation (Signed)
 Anesthesia Evaluation  Patient identified by MRN, date of birth, ID band Patient awake    Reviewed: Allergy & Precautions, H&P , NPO status , Patient's Chart, lab work & pertinent test results, reviewed documented beta blocker date and time   Airway Mallampati: II  TM Distance: >3 FB Neck ROM: full    Dental no notable dental hx. (+) Dental Advisory Given, Teeth Intact   Pulmonary neg pulmonary ROS   Pulmonary exam normal breath sounds clear to auscultation       Cardiovascular Exercise Tolerance: Good negative cardio ROS Normal cardiovascular exam Rhythm:regular Rate:Normal  Precordial CP history   Neuro/Psych  Headaches  Anxiety      negative psych ROS   GI/Hepatic negative GI ROS, Neg liver ROS,,,  Endo/Other  negative endocrine ROS    Renal/GU negative Renal ROS  negative genitourinary   Musculoskeletal   Abdominal   Peds  Hematology negative hematology ROS (+)   Anesthesia Other Findings   Reproductive/Obstetrics negative OB ROS                              Anesthesia Physical Anesthesia Plan  ASA: 1  Anesthesia Plan: General   Post-op Pain Management: Dilaudid  IV   Induction: Intravenous  PONV Risk Score and Plan: Ondansetron , Dexamethasone , Scopolamine  patch - Pre-op and Midazolam  Airway Management Planned: Oral ETT  Additional Equipment: None  Intra-op Plan:   Post-operative Plan: Extubation in OR  Informed Consent: I have reviewed the patients History and Physical, chart, labs and discussed the procedure including the risks, benefits and alternatives for the proposed anesthesia with the patient or authorized representative who has indicated his/her understanding and acceptance.     Dental Advisory Given  Plan Discussed with: CRNA  Anesthesia Plan Comments:         Anesthesia Quick Evaluation

## 2023-11-10 NOTE — Progress Notes (Signed)
 [facility]  Updated her husband. Will send in Rx. Will do phone call in a few weeks.  [name], MD [facility] [address] [place], [place] [address] [phone] (office)

## 2023-11-10 NOTE — Transfer of Care (Signed)
 Immediate Anesthesia Transfer of Care Note  Patient: Sheila KATHEE Little  Procedure(s) Performed: CHOLECYSTECTOMY, ROBOT-ASSISTED, LAPAROSCOPIC (Abdomen) BIOPSY, LIVER (Abdomen)  Patient Location: PACU  Anesthesia Type:General  Level of Consciousness: awake, alert , and oriented  Airway & Oxygen Therapy: Patient Spontanous Breathing  Post-op Assessment: Report given to RN and Post -op Vital signs reviewed and stable  Post vital signs: Reviewed and stable  Last Vitals:  Vitals Value Taken Time  BP 94/54 11/10/23 12:30  Temp 36.6 C 11/10/23 12:29  Pulse 73 11/10/23 12:31  Resp 10 11/10/23 12:31  SpO2 100 % 11/10/23 12:31  Vitals shown include unfiled device data.  Last Pain:  Vitals:   11/10/23 0938  PainSc: 2          Complications: No notable events documented.

## 2023-11-10 NOTE — Anesthesia Procedure Notes (Signed)
 Procedure Name: Intubation Date/Time: 11/10/2023 11:03 AM  Performed by: Barbarann Verneita RAMAN, CRNAPre-anesthesia Checklist: Patient identified, Patient being monitored, Timeout performed, Emergency Drugs available and Suction available Patient Re-evaluated:Patient Re-evaluated prior to induction Oxygen Delivery Method: Circle system utilized Preoxygenation: Pre-oxygenation with 100% oxygen Induction Type: IV induction Ventilation: Mask ventilation without difficulty Laryngoscope Size: Mac and 3 Grade View: Grade I Tube type: Oral Tube size: 7.0 mm Number of attempts: 1 Airway Equipment and Method: Stylet Placement Confirmation: ETT inserted through vocal cords under direct vision, positive ETCO2 and breath sounds checked- equal and bilateral Secured at: 21 cm Tube secured with: Tape Dental Injury: Teeth and Oropharynx as per pre-operative assessment

## 2023-11-10 NOTE — Discharge Instructions (Addendum)
 Discharge Robotic Assisted Laparoscopic Surgery Instructions:  Do not take your cannabidol while taking any narcotic pain medication.   Common Complaints: Right shoulder pain is common after laparoscopic surgery.  This is secondary to the gas used in the surgery being trapped under the diaphragm.  Walk to help your body absorb the gas. This will improve in a few days. Pain at the port sites are common, especially the larger port sites. This will improve with time.  Some nausea is common and poor appetite. The main goal is to stay hydrated the first few days after surgery.   Diet/ Activity: Diet as tolerated. You may not have an appetite, but it is important to stay hydrated.  Drink 64 ounces of water  a day. Your appetite will return with time.  Shower per your regular routine daily.  Do not take hot showers. Take warm showers that are less than 10 minutes. Rest and listen to your body, but do not remain in bed all day.  Walk everyday for at least 15-20 minutes. Deep cough and move around every 1-2 hours in the first few days after surgery.  Do not lift > 10 lbs, perform excessive bending, pushing, pulling, squatting for 1-2 weeks after surgery.  Do not pick at the dermabond glue on your incision sites.  This glue film will remain in place for 1-2 weeks and will start to peel off.  Do not place lotions or balms on your incision unless instructed to specifically by Dr. Kallie.   Pain Expectations and Narcotics: -After surgery you will have pain associated with your incisions and this is normal. The pain is muscular and nerve pain, and will get better with time. -You are encouraged and expected to take non narcotic medications like tylenol  and ibuprofen  (when able) to treat pain as multiple modalities can aid with pain treatment. -Narcotics are only used when pain is severe or there is breakthrough pain. -You are not expected to have a pain score of 0 after surgery, as we cannot prevent pain.  A pain score of 3-4 that allows you to be functional, move, walk, and tolerate some activity is the goal. The pain will continue to improve over the days after surgery and is dependent on your surgery. -Due to [place] law, we are only able to give a certain amount of pain medication to treat post operative pain, and we only give additional narcotics on a patient by patient basis.  -For most laparoscopic surgery, studies have shown that the majority of patients only need 10-15 narcotic pills, and for open surgeries most patients only need 15-20.   -Having appropriate expectations of pain and knowledge of pain management with non narcotics is important as we do not want anyone to become addicted to narcotic pain medication.  -Using ice packs in the first 48 hours and heating pads after 48 hours, wearing an abdominal binder (when recommended), and using over the counter medications are all ways to help with pain management.   -Simple acts like meditation and mindfulness practices after surgery can also help with pain control and research has proven the benefit of these practices.  Medication: Take tylenol  and ibuprofen  as needed for pain control, alternating every 4-6 hours.  Example:  Tylenol  1000mg  @ 6am, 12noon, 6pm, (Do not exceed 4000mg  of tylenol  a day). Ibuprofen  800mg  @ 9am, 3pm, 9pm, 3am (Do not exceed 3600mg  of ibuprofen  a day).  Take Roxicodone  for breakthrough pain every 4 hours.  Take Colace for constipation related  to narcotic pain medication. If you do not have a bowel movement in 2 days, take Miralax  over the counter.  Drink plenty of water  to also prevent constipation.   Contact Information: If you have questions or concerns, please call our office, [phone], Monday- Thursday 8AM-5PM and Friday 8AM-12Noon.  If it is after hours or on the weekend, please call [facility], [phone], [phone], and ask to speak to the surgeon on call for Dr. Kallie at [facility].

## 2023-11-10 NOTE — Anesthesia Postprocedure Evaluation (Signed)
 Anesthesia Post Note  Patient: Sheila Little  Procedure(s) Performed: CHOLECYSTECTOMY, ROBOT-ASSISTED, LAPAROSCOPIC (Abdomen) BIOPSY, LIVER (Abdomen)  Patient location during evaluation: PACU Anesthesia Type: General Level of consciousness: awake and alert Pain management: pain level controlled Vital Signs Assessment: post-procedure vital signs reviewed and stable Respiratory status: spontaneous breathing, nonlabored ventilation, respiratory function stable and patient connected to nasal cannula oxygen Cardiovascular status: blood pressure returned to baseline and stable Postop Assessment: no apparent nausea or vomiting Anesthetic complications: no   There were no known notable events for this encounter.   Last Vitals:  Vitals:   11/10/23 1330 11/10/23 1345  BP: (!) 88/48 (!) 94/59  Pulse: 64 65  Resp: 16 16  Temp:    SpO2: 100% 100%    Last Pain:  Vitals:   11/10/23 1344  PainSc: 8                  [name]

## 2023-11-10 NOTE — Interval H&P Note (Signed)
 History and Physical Interval Note:  11/10/2023 10:01 AM  Sheila Little  has presented today for surgery, with the diagnosis of CHRONIC EPIGASTRIC PAIN TRANSAMINITIS.  The various methods of treatment have been discussed with the patient and family. After consideration of risks, benefits and other options for treatment, the patient has consented to  Procedure(s): CHOLECYSTECTOMY, ROBOT-ASSISTED, LAPAROSCOPIC (N/A) BIOPSY, LIVER (N/A) as a surgical intervention.  The patient's history has been reviewed, patient examined, no change in status, stable for surgery.  I have reviewed the patient's chart and labs.  Questions were answered to the patient's satisfaction.    After much discussion and concurrence with GI, plan for cholecystectomy and liver biopsy. Discussed with patient and husband and discussed concerns. PLAN: I counseled the patient about the indication, risks and benefits of robotic assisted laparoscopic cholecystectomy.  She understands there is a very small chance for bleeding, infection, injury to normal structures (including common bile duct), conversion to open surgery, persistent symptoms, evolution of postcholecystectomy diarrhea, need for secondary interventions, anesthesia reaction, cardiopulmonary issues and other risks not specifically detailed here. I described the expected recovery, the plan for follow-up and the restrictions during the recovery phase.  All questions were answered.   Sheila Little

## 2023-11-10 NOTE — Op Note (Addendum)
 [facility] Operative Note  11/10/23  Preoperative Diagnosis: Epigastric pain, transaminitis, hepatomegaly, choledocholithiasis    Postoperative Diagnosis: Same   Procedure(s) Performed: Robotic Assisted Laparoscopic Cholecystectomy   Surgeon: Manuelita C. Kallie, MD   Assistants: No qualified resident was available    Anesthesia: General endotracheal   Anesthesiologist: Landry Dunnings, MD    Specimens: Gallbladder   Estimated Blood Loss: 10 cc    Blood Replacement: None    Complications: None   Wound Class: Contaminated   Operative Indications: The patient was found to have concern for choledocholithiasis and hepatomegaly on imaging after presenting to the hospital with elevated liver test and epigastric pain. She was worked up  thoroughly to rule out other etiology and gastroenterology was consulted. After their consult and further work up, the best hypothesis is that the patient passed a stone or had sludge/sandy bile and this caused her to have the elevated liver test/ epigastric pain and concern for choledocholithiasis.  GI agreed with cholecystectomy and liver biopsy given the hepatomegaly.  We discussed the risk of the procedure including but not limited to bleeding, infection, injury to the common bile duct, bile leak, need for further procedures, chance of subtotal cholecystectomy.   Findings:  Large liver (right lobe below the level of the umbilicus) Distended gallbladder Critical view of safety noted All clips intact at the end of the case Adequate hemostasis   Procedure: Firefly was given in the preoperative area. The patient was taken to the operating room and placed supine. General endotracheal anesthesia was induced. Intravenous antibiotics were  administered per protocol.  An orogastric tube positioned to decompress the stomach. The abdomen was prepared and draped in the usual sterile fashion.   Veress needle was placed at the supraumbilical  area and insufflation was started after confirming a positive saline drop test and no immediate increase in abdominal pressure.  After reaching 15 mm, the Veress needle was removed and a 8 mm port was placed via optiview technique supraumbilical, measuring 20 mm away from the suspected position of the gallbladder.  The abdomen was inspected and no abnormalities or injuries were found.  Under direct vision, ports were placed in the following locations in a semi curvilinear position around the target of the gallbladder. Due to the size of her liver, going below the level of the umbilicus, I placed two 8 mm ports on the patient's lower right side each having 8cm clearance to the adjacent ports and one 8 mm port placed on the patient's left 8 cm from the umbilical port. Once ports were placed, the table was placed in the reverse Trendelenburg position with the right side up. The robotic platform was brought into the operative field and docked to the ports successfully.  An endoscope was placed through the umbilical port, prograsper through the most lateral right port, forced bipolar to the port just right of the umbilicus, and then a hook cautery in the left port.   The dome of the gallbladder was grasped with prograsp and retracted over the dome of the liver. Adhesions between the gallbladder and omentum, duodenum and transverse colon were lysed via hook cautery. The infundibulum was grasped with the forced bipolar and retracted toward the right lower quadrant. This maneuver exposed Calot's triangle. Firefly was used throughout the dissection to ensure safe visualization of the cystic duct.The peritoneum overlying the gallbladder infundibulum was then dissected and the cystic duct and cystic artery identified.  Critical view of safety with the liver bed clearly visible  behind the duct and artery with no additional structures noted.  The cystic duct and cystic artery were doubly clipped and divided close to the  gallbladder.  there was a small posterior arterial branch < 1mm going into the gallbladder, this was clipped once.   The gallbladder was then dissected from its peritoneal and liver bed attachments by electrocautery. A wedge liver biopsy was made in the large right lobe edge. Hemostasis was checked prior to removing the hook cautery.  The gallbladder was large and thin walled, as I was putting it in the bag, there was minor spillage of bile into the bag. This was mostly contained in the bag. A 5mm Endo Catch bag was then placed through the left side port. The robotic was undocked and moved out of the field,  and the gallbladder was removed in the bag.  The gallbladder was passed off the table as a specimen. There was no evidence of bleeding from the gallbladder fossa or cystic artery or leakage of the bile from the cystic duct stump. I suctioned out any blood or bile remaining.  The left port site closed with a 0 vicryl and PMI due to dilation from removing the gallbladder. I placed Surgical Snow on the wedge liver biopsy site. The abdomen was desufflated and secondary trocars were removed under direct vision.   Marcaine  was injected. No bleeding was noted. All skin incisions were closed with subcuticular sutures of 4-0 monocryl and dermabond.   Final inspection revealed acceptable hemostasis. All counts were correct at the end of the case. The patient was awakened from anesthesia and extubate without complication. The OG tube was removed.  The patient went to the PACU in stable condition.   [name], MD [facility] [address] [place], [place] [address] [phone] (office)

## 2023-11-11 ENCOUNTER — Encounter (HOSPITAL_COMMUNITY): Payer: Self-pay | Admitting: General Surgery

## 2023-11-11 ENCOUNTER — Telehealth (INDEPENDENT_AMBULATORY_CARE_PROVIDER_SITE_OTHER): Admitting: General Surgery

## 2023-11-11 DIAGNOSIS — K8050 Calculus of bile duct without cholangitis or cholecystitis without obstruction: Secondary | ICD-10-CM

## 2023-11-11 DIAGNOSIS — R160 Hepatomegaly, not elsewhere classified: Secondary | ICD-10-CM

## 2023-11-11 LAB — SURGICAL PATHOLOGY

## 2023-11-11 NOTE — Telephone Encounter (Signed)
 [facility]  Patient requested a call.   Patient asking when she can get in a pool - told her like 4 weeks.  Patient asking about a puppy and walking a puppy- told her that should be fine.   She asked about removing her nausea patch- told her she can remove if she wants.  Sheila Pander, MD [facility] [address] [place], [place] [address] [phone] (office)

## 2023-11-12 ENCOUNTER — Ambulatory Visit (INDEPENDENT_AMBULATORY_CARE_PROVIDER_SITE_OTHER): Payer: Self-pay | Admitting: General Surgery

## 2023-11-12 DIAGNOSIS — R160 Hepatomegaly, not elsewhere classified: Secondary | ICD-10-CM

## 2023-11-12 DIAGNOSIS — K8050 Calculus of bile duct without cholangitis or cholecystitis without obstruction: Secondary | ICD-10-CM

## 2023-11-12 NOTE — Progress Notes (Signed)
 Updated patient chronic cholecystitis with green viscous bile and benign liver parenchyma. Discussed her worry about the clips in place. Discussed that this is benign/ inert material that we use in variety of surgeries and not anything to worry about.

## 2023-11-25 ENCOUNTER — Ambulatory Visit (INDEPENDENT_AMBULATORY_CARE_PROVIDER_SITE_OTHER): Admitting: General Surgery

## 2023-11-25 DIAGNOSIS — K8050 Calculus of bile duct without cholangitis or cholecystitis without obstruction: Secondary | ICD-10-CM

## 2023-11-25 DIAGNOSIS — R160 Hepatomegaly, not elsewhere classified: Secondary | ICD-10-CM

## 2023-11-26 NOTE — Patient Instructions (Signed)
Diet and activity as tolerated.

## 2023-11-26 NOTE — Progress Notes (Signed)
 [facility]  Patient came in with her mother for another appointment so she was here in person today.   No major complaints. Some soreness still at the port sites. Eating and having bms.  LMP 10/19/2023 Comment: Urine Pregnancy Test Negative as of 11/06/23. Port sites c/d/I with glue intact, no erythema or drainage  Patient s/p robotic cholecystectomy and liver biopsy. Doing well. Pathology was benign.   Diet and activity as tolerated PRN follow up  [name], MD [facility] [address] [place], [place] [address] [phone] (office)

## 2023-12-18 ENCOUNTER — Telehealth (INDEPENDENT_AMBULATORY_CARE_PROVIDER_SITE_OTHER): Admitting: Surgery

## 2023-12-18 DIAGNOSIS — T792XXA Traumatic secondary and recurrent hemorrhage and seroma, initial encounter: Secondary | ICD-10-CM

## 2023-12-18 NOTE — Telephone Encounter (Signed)
 [facility]  Patient called secondary to yellow/pink drainage noted within her umbilicus this evening after showering.  She is status post robotic assisted laparoscopic cholecystectomy and liver wedge biopsy on 8/11.  She states that her incisions have healed without issue, but starting this evening after she got out of the shower, she noted a clearish yellow/pink drainage within her umbilicus.  She does not believe that this fluid is coming from her umbilical incision site, as this area appeals fully healed.  She is noting that within her umbilicus she sees a small knot that potentially is the area of drainage.  The fluid slowly re-pools within her umbilicus after she blots the area.  She notes a small amount of erythema within her umbilicus, but no tenderness or surrounding erythema.  Patient is tolerating a diet without nausea and vomiting, and moving her bowels without issue.  She denies fevers and chills.    Based on the patient's description, I explained that she likely had a small seroma related to her umbilical incision site, and she developed a break in the skin, allowing the fluid to drain.  From her description, the area does not sound infected.  Patient plans to monitor the area.  I advised that she can see our nurse in the office tomorrow morning if the area continues to drain vs following up with Dr. Kallie next week.  She will call the office to let us  know if she would like it evaluated tomorrow morning.  I advised that she needs to present to an Urgent Care/ED or call our office if she begins to note purulent drainage from the site, worsening erythema, tenderness, fever, or chills.  All questions answered to her expressed satisfaction.  Dorothyann Brittle, DO [facility] [address] [place], [place] [address] [phone] (office)

## 2023-12-19 NOTE — Telephone Encounter (Signed)
 Patient called office.   States that she only has slight yellow tinged drainage. States that she has no pain or discomfort in the area.  Denies redness or S/ Sx of infection.   Reports that she will call office if Sx change or worsen.
